# Patient Record
Sex: Male | Born: 1965 | Race: Black or African American | Hispanic: No | Marital: Single | Smoking: Never smoker
Health system: Southern US, Community
[De-identification: ages and names within clinical notes are randomized; demographics above are authoritative.]

## PROBLEM LIST (undated history)

## (undated) DIAGNOSIS — F209 Schizophrenia, unspecified: Secondary | ICD-10-CM

## (undated) DIAGNOSIS — M869 Osteomyelitis, unspecified: Secondary | ICD-10-CM

## (undated) DIAGNOSIS — E119 Type 2 diabetes mellitus without complications: Secondary | ICD-10-CM

## (undated) HISTORY — PX: BELOW KNEE LEG AMPUTATION: SUR23

---

## 2013-12-08 ENCOUNTER — Emergency Department (HOSPITAL_COMMUNITY): Payer: Medicaid Other

## 2013-12-08 ENCOUNTER — Encounter (HOSPITAL_COMMUNITY): Payer: Self-pay | Admitting: Emergency Medicine

## 2013-12-08 ENCOUNTER — Emergency Department (HOSPITAL_COMMUNITY)
Admission: EM | Admit: 2013-12-08 | Discharge: 2013-12-08 | Disposition: A | Discharge status: Home / Self Care | Payer: Medicaid Other | Attending: Emergency Medicine | Admitting: Emergency Medicine

## 2013-12-08 DIAGNOSIS — L02619 Cutaneous abscess of unspecified foot: Secondary | ICD-10-CM

## 2013-12-08 DIAGNOSIS — Z8739 Personal history of other diseases of the musculoskeletal system and connective tissue: Secondary | ICD-10-CM

## 2013-12-08 DIAGNOSIS — Z9114 Patient's other noncompliance with medication regimen: Secondary | ICD-10-CM

## 2013-12-08 DIAGNOSIS — E119 Type 2 diabetes mellitus without complications: Secondary | ICD-10-CM | POA: Diagnosis present

## 2013-12-08 DIAGNOSIS — Z9119 Patient's noncompliance with other medical treatment and regimen: Secondary | ICD-10-CM

## 2013-12-08 DIAGNOSIS — E1149 Type 2 diabetes mellitus with other diabetic neurological complication: Secondary | ICD-10-CM

## 2013-12-08 DIAGNOSIS — Z89512 Acquired absence of left leg below knee: Secondary | ICD-10-CM

## 2013-12-08 DIAGNOSIS — L03119 Cellulitis of unspecified part of limb: Principal | ICD-10-CM

## 2013-12-08 DIAGNOSIS — E1142 Type 2 diabetes mellitus with diabetic polyneuropathy: Secondary | ICD-10-CM

## 2013-12-08 DIAGNOSIS — E1021 Type 1 diabetes mellitus with diabetic nephropathy: Secondary | ICD-10-CM

## 2013-12-08 DIAGNOSIS — F39 Unspecified mood [affective] disorder: Secondary | ICD-10-CM

## 2013-12-08 DIAGNOSIS — L0390 Cellulitis, unspecified: Secondary | ICD-10-CM | POA: Diagnosis present

## 2013-12-08 DIAGNOSIS — S88119A Complete traumatic amputation at level between knee and ankle, unspecified lower leg, initial encounter: Secondary | ICD-10-CM

## 2013-12-08 DIAGNOSIS — Z91199 Patient's noncompliance with other medical treatment and regimen due to unspecified reason: Secondary | ICD-10-CM

## 2013-12-08 DIAGNOSIS — G8921 Chronic pain due to trauma: Secondary | ICD-10-CM | POA: Insufficient documentation

## 2013-12-08 DIAGNOSIS — N058 Unspecified nephritic syndrome with other morphologic changes: Secondary | ICD-10-CM | POA: Insufficient documentation

## 2013-12-08 DIAGNOSIS — L03115 Cellulitis of right lower limb: Secondary | ICD-10-CM

## 2013-12-08 DIAGNOSIS — E1029 Type 1 diabetes mellitus with other diabetic kidney complication: Secondary | ICD-10-CM | POA: Insufficient documentation

## 2013-12-08 DIAGNOSIS — E1140 Type 2 diabetes mellitus with diabetic neuropathy, unspecified: Secondary | ICD-10-CM

## 2013-12-08 DIAGNOSIS — Z91148 Patient's other noncompliance with medication regimen for other reason: Secondary | ICD-10-CM

## 2013-12-08 HISTORY — DX: Type 2 diabetes mellitus without complications: E11.9

## 2013-12-08 HISTORY — DX: Osteomyelitis, unspecified: M86.9

## 2013-12-08 LAB — CBC WITH DIFFERENTIAL/PLATELET
Basophils Absolute: 0 10*3/uL (ref 0.0–0.1)
Basophils Relative: 0 % (ref 0–1)
EOS ABS: 0.2 10*3/uL (ref 0.0–0.7)
EOS PCT: 2 % (ref 0–5)
HCT: 41.6 % (ref 39.0–52.0)
HEMOGLOBIN: 14.1 g/dL (ref 13.0–17.0)
LYMPHS PCT: 29 % (ref 12–46)
Lymphs Abs: 2.2 10*3/uL (ref 0.7–4.0)
MCH: 29.1 pg (ref 26.0–34.0)
MCHC: 33.9 g/dL (ref 30.0–36.0)
MCV: 86 fL (ref 78.0–100.0)
MONOS PCT: 8 % (ref 3–12)
Monocytes Absolute: 0.6 10*3/uL (ref 0.1–1.0)
Neutro Abs: 4.5 10*3/uL (ref 1.7–7.7)
Neutrophils Relative %: 61 % (ref 43–77)
Platelets: 293 10*3/uL (ref 150–400)
RBC: 4.84 MIL/uL (ref 4.22–5.81)
RDW: 13.6 % (ref 11.5–15.5)
WBC: 7.5 10*3/uL (ref 4.0–10.5)

## 2013-12-08 LAB — CBG MONITORING, ED
Glucose-Capillary: 94 mg/dL (ref 70–99)
Glucose-Capillary: 74 mg/dL (ref 70–99)

## 2013-12-08 LAB — BASIC METABOLIC PANEL
BUN: 30 mg/dL — AB (ref 6–23)
CALCIUM: 9.7 mg/dL (ref 8.4–10.5)
CO2: 27 mEq/L (ref 19–32)
Chloride: 100 mEq/L (ref 96–112)
Creatinine, Ser: 1.1 mg/dL (ref 0.50–1.35)
GFR calc Af Amer: >90 mL/min (ref >90)
GFR, EST NON AFRICAN AMERICAN: 79 mL/min — AB (ref >90)
Glucose, Bld: 69 mg/dL — ABNORMAL LOW (ref 70–99)
Potassium: 4.7 mEq/L (ref 3.7–5.3)
SODIUM: 140 meq/L (ref 137–147)

## 2013-12-08 LAB — SEDIMENTATION RATE: Sed Rate: 14 mm/hr (ref 0–16)

## 2013-12-08 MED ORDER — SODIUM CHLORIDE 0.9 % IV BOLUS (SEPSIS)
500.0000 mL | Freq: Once | INTRAVENOUS | Status: AC
  Administered 2013-12-08: 500 mL via INTRAVENOUS

## 2013-12-08 MED ORDER — CLINDAMYCIN HCL 150 MG PO CAPS: 450.0000 mg | ORAL_CAPSULE | Freq: Three times a day (TID) | ORAL | Status: DC

## 2013-12-08 MED ORDER — VANCOMYCIN HCL IN DEXTROSE 1-5 GM/200ML-% IV SOLN
1000.0000 mg | Freq: Once | INTRAVENOUS | Status: AC
  Administered 2013-12-08: 1000 mg via INTRAVENOUS
  Filled 2013-12-08: qty 200

## 2013-12-08 MED ORDER — CIPROFLOXACIN HCL 500 MG PO TABS
500.0000 mg | ORAL_TABLET | Freq: Once | ORAL | Status: AC
  Administered 2013-12-08: 500 mg via ORAL
  Filled 2013-12-08: qty 1

## 2013-12-08 MED ORDER — CIPROFLOXACIN HCL 500 MG PO TABS: 500.0000 mg | ORAL_TABLET | Freq: Two times a day (BID) | ORAL | Status: DC

## 2013-12-08 NOTE — ED Notes (Addendum)
Pt reports hx of diabetes and osteomyelitis which caused him to have amputation of left leg in past. Reports already had wound to right foot then had a fall approx one week ago after getting hypoglycemic. Now having increase in swelling and redness to right foot. Reports instead of pain, now has numbness to right foot.

## 2013-12-08 NOTE — ED Notes (Signed)
Spoke with pt. About starting an IV.  He  Is not sure that he wants an IV.   Explained to pt. The importance of the IV.  He is deciding if he wants an IV.  Will be back into check on him and decision.  Pt.s family is also encouraging him to have the IV  HumphreysNicole, [place] will be into speak with the pt.

## 2013-12-08 NOTE — ED Notes (Signed)
Patel MD internal medicine at bedside

## 2013-12-08 NOTE — ED Notes (Signed)
Apologized to patient about wait time. Patient understanding, calm. NAD noted. Denies constant pain.

## 2013-12-08 NOTE — ED Provider Notes (Addendum)
Patient reports he fell out of his wheelchair about 3 weeks ago and has had increasing redness and swelling of his right ankle. He has already had a amputation of his left foot for osteomyelitis.  Patient has diffuse redness and warmth with swelling especially over the medial aspect of his right foot/ankle. Please see photo.    Medical screening examination/treatment/procedure(s) were conducted as a shared visit with non-physician practitioner(s) and myself.  I personally evaluated the patient during the encounter.   EKG Interpretation None         Devoria AlbeIva Knapp, MD, Armando GangFACEP   Ward GivensIva L Knapp, MD 12/08/13 Aretha Parrot1937  Ward GivensIva L Knapp, MD 12/08/13 2016

## 2013-12-08 NOTE — ED Provider Notes (Signed)
CSN: [id number]     Arrival date & time 12/08/13  1327 History   First MD Initiated Contact with Patient 12/08/13 1559     Chief Complaint  Patient presents with  . Foot Pain     (Consider location/radiation/quality/duration/timing/severity/associated sxs/prior Treatment) HPI  Renato BattlesScott Perriello is a 48 y.o. male past medical history significant for type 1 diabetes, and describes it as brittle and difficult to control. Patient states that he was trying to manage his own diabetes for a long time without any medical oversight. He is presenting today for evaluation of right medial malleolus or the redness and paresthesia. Patient trauma to the area 3 weeks ago when he hit it on a kitchen counter. He has face line diabetic neuropathy which he believes is getting worse and has pins and needles shooting paresthesias throughout his extremities. Patient denies fever, chills, nausea, vomiting, chest pain, shortness of breath, change in bowel or bladder habits. The patient follows with podiatrist Dr. Romualdo [facility]. States he has a prior cellulitis to this area that required antibiotic palate insertion into the right medial malleolus. He was on vancomycin for several weeks as an outpatient. Patient required a wound vac as well. Patient lost the left lower extremity to an osteomyelitis in October of 2014.   Past Medical History  Diagnosis Date  . Diabetes mellitus without complication   . Osteomyelitis    Past Surgical History  Procedure Laterality Date  . Below knee leg amputation     History reviewed. No pertinent family history. History  Substance Use Topics  . Smoking status: Not on file  . Smokeless tobacco: Not on file  . Alcohol Use: No    Review of Systems  10 systems reviewed and found to be negative, except as noted in the HPI.   Allergies  Advil  Home Medications   Prior to Admission medications   Not on File   BP 150/86  Pulse 103  Temp(Src) 97.9 F (36.6 C) (Oral)   Resp 16  SpO2 100% Physical Exam  Nursing note and vitals reviewed. Constitutional: He is oriented to person, place, and time. He appears well-developed and well-nourished. No distress.  HENT:  Head: Normocephalic.  Mouth/Throat: Oropharynx is clear and moist.  Eyes: Conjunctivae and EOM are normal. Pupils are equal, round, and reactive to light.  Neck: Normal range of motion.  Cardiovascular: Normal rate, regular rhythm and intact distal pulses.   Pulmonary/Chest: Effort normal and breath sounds normal. No stridor. No respiratory distress. He has no wheezes. He has no rales. He exhibits no tenderness.  Abdominal: Soft. Bowel sounds are normal. He exhibits no distension and no mass. There is no tenderness. There is no rebound and no guarding.  Musculoskeletal: Normal range of motion. He exhibits edema and tenderness.  Left BKA  Neurological: He is alert and oriented to person, place, and time.  Skin:  5 cm area of erythema, warmth to right ankle medial malleolus. Reduced distal pulses. Patient is able to move all toes. Distal sensation is grossly intact.  Psychiatric: He has a normal mood and affect.        ED Course  Procedures (including critical care time) Labs Review Labs Reviewed  BASIC METABOLIC PANEL - Abnormal; Notable for the following:    Glucose, Bld 69 (*)    BUN 30 (*)    GFR calc non Af Amer 79 (*)    All other components within normal limits  CULTURE, BLOOD (ROUTINE X 2)  CULTURE, BLOOD (ROUTINE X 2)  CBC WITH DIFFERENTIAL  CBG MONITORING, ED  CBG MONITORING, ED    Imaging Review Dg Foot Complete Right  12/08/2013   CLINICAL DATA:  Pain and swelling.  EXAM: RIGHT FOOT COMPLETE - 3+ VIEW  COMPARISON:  CT scan 09/30/2013.  FINDINGS: Severe chronic deformity involving the Ankle Joint, midfoot and hindfoot. The talus is likely surgically absent but I do not see any solid fusion changes. There is severe osteoporosis. The forefoot bony structures are intact.   IMPRESSION: Severe chronic ankle and hindfoot changes.  Osteoporosis.  No acute bony findings.   Electronically Signed   By: Loralie ChampagneMark  Gallerani M.D.   On: 12/08/2013 17:40     EKG Interpretation None      MDM   Final diagnoses:  Cellulitis of right foot  Hx of BKA, left  Type 1 diabetes mellitus with nephropathy  History of medication noncompliance   Filed Vitals:   12/08/13 2100 12/08/13 2130 12/08/13 2145 12/08/13 2200  BP: 137/78 149/81  152/81  Pulse: 101  100 97  Temp:      TempSrc:      Resp: 15  16 15   SpO2: 100%  100% 99%    Medications  sodium chloride 0.9 % bolus 500 mL (not administered)  sodium chloride 0.9 % bolus 500 mL (not administered)  vancomycin (VANCOCIN) IVPB 1000 mg/200 mL premix (not administered)    Renato BattlesScott Gehres is a 48 y.o. male presenting with increasing peripheral neuropathy and cellulitis to right medial malleolus. Patient had trauma approximately 3 weeks ago. Redness and warmth have worsened over the last few weeks.x-ray shows no signs of osteomyelitis. Patient with no systemic signs of infection, no leukocytosis.  Orthopedic consult from Dr. Jean Rosenthalhristine Wright appreciated: Agrees with admission for IV antibiotics as there is no indication for emergent surgical intervention. The patient would like to continue following with Dr. Romualdo Bolkial he would have to be [facility].   Dr. Allena KatzPatel, has evaluated the patient does not feel that the patient warned to admission. He is toxic the patient's podiatrist on call. Plan is to administer by mouth antibiotics and have the patient follow in the office tomorrow.  This is a shared visit with the attending physician who personally evaluated the patient and agrees with the care plan.    Evaluation does not show pathology that would require ongoing emergent intervention or inpatient treatment. Pt is hemodynamically stable and mentating appropriately. Discussed findings and plan with patient/guardian, who  agrees with care plan. All questions answered. Return precautions discussed and outpatient follow up given.   Discharge Medication List as of 12/08/2013 10:03 PM    START taking these medications   Details  ciprofloxacin (CIPRO) 500 MG tablet Take 1 tablet (500 mg total) by mouth 2 (two) times daily. One po bid x 7 days, Starting 12/08/2013, Until Discontinued, Print    clindamycin (CLEOCIN) 150 MG capsule Take 3 capsules (450 mg total) by mouth 3 (three) times daily., Starting 12/08/2013, Until Discontinued, [employer]           Nicole Pisciotta, PA-C 12/09/13 [phone]

## 2013-12-08 NOTE — Discharge Instructions (Signed)
Do not hesitate to return to the Emergency Department for any new, worsening or concerning symptoms.   If you do not have a primary care doctor you can establish one at the   [facility]: [address] [place] [id number] [phone]  After you establish care. Let them know you were seen in the emergency room. They must obtain records for further management.    Cellulitis Cellulitis is an infection of the skin and the tissue beneath it. The infected area is usually red and tender. Cellulitis occurs most often in the arms and lower legs.  CAUSES  Cellulitis is caused by bacteria that enter the skin through cracks or cuts in the skin. The most common types of bacteria that cause cellulitis are Staphylococcus and Streptococcus. SYMPTOMS   Redness and warmth.  Swelling.  Tenderness or pain.  Fever. DIAGNOSIS  Your caregiver can usually determine what is wrong based on a physical exam. Blood tests may also be done. TREATMENT  Treatment usually involves taking an antibiotic medicine. HOME CARE INSTRUCTIONS   Take your antibiotics as directed. Finish them even if you start to feel better.  Keep the infected arm or leg elevated to reduce swelling.  Apply a warm cloth to the affected area up to 4 times per day to relieve pain.  Only take over-the-counter or prescription medicines for pain, discomfort, or fever as directed by your caregiver.  Keep all follow-up appointments as directed by your caregiver. SEEK MEDICAL CARE IF:   You notice red streaks coming from the infected area.  Your red area gets larger or turns dark in color.  Your bone or joint underneath the infected area becomes painful after the skin has healed.  Your infection returns in the same area or another area.  You notice a swollen bump in the infected area.  You develop new symptoms. SEEK IMMEDIATE MEDICAL CARE IF:   You have a fever.  You feel very sleepy.  You develop vomiting  or diarrhea.  You have a general ill feeling (malaise) with muscle aches and pains. MAKE SURE YOU:   Understand these instructions.  Will watch your condition.  Will get help right away if you are not doing well or get worse. Document Released: 03/13/2005 Document Revised: 12/03/2011 Document Reviewed: 08/19/2011 [facility] Patient Information 2015 [employer], [place]. This information is not intended to replace advice given to you by your health care provider. Make sure you discuss any questions you have with your health care provider.

## 2013-12-08 NOTE — Consult Note (Addendum)
[facility] Initial consult note  Patient: Dale Hunter  FRT:[id number]  DOB: Nov 14, 1965  DOS: the patient was seen and examined on 12/08/2013 PCP: [facility]  Chief Complaint: Right foot redness  HPI: Dale Hunter is a 48 y.o. male with Past medical history of diabetes mellitus, osteomyelitis, charcot foot. Patient presented with complaints of right foot redness. He mentions 3 weeks ago he had an episode of hypoglycemia and was in the kitchen and had of fall with which he hit the kitchen floor and had some injury on his right foot. He mentions after this injury he has seen his podiatrist and was recommended conservative management. Today he comes back because he initially had a sensation of tingling and numbness all over his body. No fall no trauma no fever no chills no burning urination no diarrhea no constipation no chest pain or shortness of breath no nausea no vomiting no abdominal pain. No itching or rash. No dizziness no lightheadedness no vision changes. No cough no congestion. He mentions he is using short-acting insulin with a sliding scale. His PCP has prescribed him on Levemir but is not using it on a regular basis. He has extensive history of osteomyelitis with left foot BKA secondary to the same and also had extensive osteomyelitis of the right foot requiring prolonged treatment. His all the care has been done at [facility] and [facility]. He Lives with his parents  The patient is coming from home. And at his baseline independent for most of his ADL.  Review of Systems: as mentioned in the history of present illness.  A Comprehensive review of the other systems is negative.  Past Medical History  Diagnosis Date  . Diabetes mellitus without complication   . Osteomyelitis    Past Surgical History  Procedure Laterality Date  . Below knee leg amputation     Social History:  reports that he does not drink  alcohol or use illicit drugs. His tobacco history is not on file.  Allergies  Allergen Reactions  . Advil [Ibuprofen] Rash    History reviewed. No pertinent family history.  Prior to Admission medications   Medication Sig Start Date End Date Taking? Authorizing Provider  insulin aspart (NOVOLOG) 100 UNIT/ML injection Inject 13 Units into the skin 3 (three) times daily before meals.   Yes Historical Provider, MD  insulin detemir (LEVEMIR) 100 UNIT/ML injection Inject 15 Units into the skin at bedtime.   Yes Historical Provider, MD  insulin regular (NOVOLIN R,HUMULIN R) 100 units/mL injection Inject 18-20 Units into the skin 3 (three) times daily before meals. Sliding scale   Yes Historical Provider, MD    Physical Exam: Filed Vitals:   12/08/13 1900 12/08/13 1915 12/08/13 1930 12/08/13 2000  BP: 162/84  160/96 161/93  Pulse: 106 108  104  Temp:      TempSrc:      Resp: '12 17  16  ' SpO2: 100% 94%  98%    General: Alert, Awake and Oriented to Time, Place and Person. Appear in no distress, appears anxious Eyes: PERRL ENT: Oral Mucosa clear moist. Neck: no JVD Cardiovascular: S1 and S2 Present, no Murmur, Peripheral Pulses Present Respiratory: Bilateral Air entry equal and Decreased, Clear to Auscultation,  no Crackles,no wheezes Abdomen: Bowel Sound Present, Soft and Non tender Skin: no Rash Extremities: no Pedal edema, no calf tenderness No tenderness on the ankle joint, toes, knee joint. Range of motion full at knee, limited at ankle  due to prior surgery Diffuse eczema on right leg, right heel redness and soft tissue swelling with some warmth. Neurologic: Grossly no focal neuro deficit. Mental status AAOx3, speech normal, attention normal, Cranial Nerves PERRL, EOM normal and present, facial sensation to light touch present: , Motor strength bilateral equal strength 5/5, Sensation present to light touch, reflexes present knee and biceps, Cerebellar test normal finger nose  finger.  Labs on Admission:  CBC:  Recent Labs Lab 12/08/13 1628  WBC 7.5  NEUTROABS 4.5  HGB 14.1  HCT 41.6  MCV 86.0  PLT 293    CMP     Component Value Date/Time   NA 140 12/08/2013 1628   K 4.7 12/08/2013 1628   CL 100 12/08/2013 1628   CO2 27 12/08/2013 1628   GLUCOSE 69* 12/08/2013 1628   BUN 30* 12/08/2013 1628   CREATININE 1.10 12/08/2013 1628   CALCIUM 9.7 12/08/2013 1628   GFRNONAA 79* 12/08/2013 1628   GFRAA >90 12/08/2013 1628    No results found for this basename: LIPASE, AMYLASE,  in the last 168 hours No results found for this basename: AMMONIA,  in the last 168 hours  No results found for this basename: CKTOTAL, CKMB, CKMBINDEX, TROPONINI,  in the last 168 hours BNP (last 3 results) No results found for this basename: PROBNP,  in the last 8760 hours  Radiological Exams on Admission: Dg Foot Complete Right  12/08/2013   CLINICAL DATA:  Pain and swelling.  EXAM: RIGHT FOOT COMPLETE - 3+ VIEW  COMPARISON:  CT scan 09/30/2013.  FINDINGS: Severe chronic deformity involving the Ankle Joint, midfoot and hindfoot. The talus is likely surgically absent but I do not see any solid fusion changes. There is severe osteoporosis. The forefoot bony structures are intact.  IMPRESSION: Severe chronic ankle and hindfoot changes.  Osteoporosis.  No acute bony findings.   Electronically Signed   By: Kalman Jewels M.D.   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 Cellulitis Patient presents with complain of left leg redness which he has noticed after his recent injury. X-ray does not show any evidence of osteomyelitis. He does not have any leukocytosis. No fever. Mild tachycardia and hypertension can be due to anxiety. Does not have any pain or new limitation of range of motion at the ankle joint or knee  joint. He does not appear toxic on examination. With this patient does not meet the criteria for an inpatient admission. Patient lives with his family and finally appears reasonable although patient appears anxious. I discussed the case with patient's primary podiatry service at [facility] on call Dr. Leane Call who agreed with the plan to follow the patient as an outpatient tomorrow. If the patient's ESR is within normal range I recommend to discharge the patient on clindamycin and ciprofloxacin which will cover both MRSA and Pseudomonas in a diabetic patient with prior history of osteomyelitis for 14 days. Clindamycin 450 mg every 8 hours. Ciprofloxacin 500 mg twice a day.  I discussed with the family about compliance with antibiotics and they will make sure the patient gets his medications. Also discussed the importance of continuation of basal bolus insulin regimen as prescribed to maintain blood sugars within acceptable range during the period of infection.  Consults: Discussed with podiatry service on call  Author:  Berle Mull, MD [facility] Pager: [phone] 12/08/2013, 9:12 PM    If 7PM-7AM, please contact night-coverage [email or web address] Password [email or web address]  **Disclaimer: This note may have been dictated with voice recognition software. Similar sounding words can inadvertently be transcribed and this note may contain transcription errors which may not have been corrected upon publication of note.**

## 2013-12-09 LAB — C-REACTIVE PROTEIN: CRP: 0.9 mg/dL — ABNORMAL HIGH (ref <0.60)

## 2013-12-10 NOTE — ED Provider Notes (Signed)
See prior note   Ward GivensIva L Knapp, MD 12/10/13 [phone]

## 2013-12-12 ENCOUNTER — Encounter (HOSPITAL_COMMUNITY): Payer: Self-pay | Admitting: Emergency Medicine

## 2013-12-12 ENCOUNTER — Emergency Department (HOSPITAL_COMMUNITY)
Admission: EM | Admit: 2013-12-12 | Discharge: 2013-12-13 | Disposition: A | Discharge status: Home / Self Care | Payer: MEDICAID | Attending: Emergency Medicine | Admitting: Emergency Medicine

## 2013-12-12 DIAGNOSIS — Z794 Long term (current) use of insulin: Secondary | ICD-10-CM | POA: Insufficient documentation

## 2013-12-12 DIAGNOSIS — F3289 Other specified depressive episodes: Secondary | ICD-10-CM | POA: Diagnosis present

## 2013-12-12 DIAGNOSIS — E119 Type 2 diabetes mellitus without complications: Secondary | ICD-10-CM | POA: Diagnosis not present

## 2013-12-12 DIAGNOSIS — Z4801 Encounter for change or removal of surgical wound dressing: Secondary | ICD-10-CM | POA: Diagnosis not present

## 2013-12-12 DIAGNOSIS — F329 Major depressive disorder, single episode, unspecified: Secondary | ICD-10-CM | POA: Insufficient documentation

## 2013-12-12 DIAGNOSIS — Z792 Long term (current) use of antibiotics: Secondary | ICD-10-CM | POA: Insufficient documentation

## 2013-12-12 DIAGNOSIS — Z8739 Personal history of other diseases of the musculoskeletal system and connective tissue: Secondary | ICD-10-CM | POA: Insufficient documentation

## 2013-12-12 DIAGNOSIS — F39 Unspecified mood [affective] disorder: Secondary | ICD-10-CM | POA: Insufficient documentation

## 2013-12-12 DIAGNOSIS — F32A Depression, unspecified: Secondary | ICD-10-CM

## 2013-12-12 DIAGNOSIS — Z79899 Other long term (current) drug therapy: Secondary | ICD-10-CM | POA: Diagnosis not present

## 2013-12-12 LAB — CBG MONITORING, ED
GLUCOSE-CAPILLARY: 240 mg/dL — AB (ref 70–99)
GLUCOSE-CAPILLARY: 246 mg/dL — AB (ref 70–99)
GLUCOSE-CAPILLARY: 411 mg/dL — AB (ref 70–99)
GLUCOSE-CAPILLARY: 484 mg/dL — AB (ref 70–99)
Glucose-Capillary: 15 mg/dL — CL (ref 70–99)

## 2013-12-12 LAB — COMPREHENSIVE METABOLIC PANEL
ALT: 30 U/L (ref 0–53)
AST: 27 U/L (ref 0–37)
Albumin: 3.5 g/dL (ref 3.5–5.2)
Alkaline Phosphatase: 77 U/L (ref 39–117)
BUN: 36 mg/dL — ABNORMAL HIGH (ref 6–23)
CO2: 24 mEq/L (ref 19–32)
Calcium: 9.6 mg/dL (ref 8.4–10.5)
Chloride: 97 mEq/L (ref 96–112)
Creatinine, Ser: 1.24 mg/dL (ref 0.50–1.35)
GFR calc Af Amer: 79 mL/min — ABNORMAL LOW (ref >90)
GFR calc non Af Amer: 68 mL/min — ABNORMAL LOW (ref >90)
Glucose, Bld: 91 mg/dL (ref 70–99)
Potassium: 4.3 mEq/L (ref 3.7–5.3)
Sodium: 137 mEq/L (ref 137–147)
Total Bilirubin: 0.4 mg/dL (ref 0.3–1.2)
Total Protein: 7.2 g/dL (ref 6.0–8.3)

## 2013-12-12 LAB — CBC WITH DIFFERENTIAL/PLATELET
Basophils Absolute: 0.1 10*3/uL (ref 0.0–0.1)
Basophils Relative: 1 % (ref 0–1)
Eosinophils Absolute: 0.1 10*3/uL (ref 0.0–0.7)
Eosinophils Relative: 1 % (ref 0–5)
HCT: 39 % (ref 39.0–52.0)
Hemoglobin: 13.4 g/dL (ref 13.0–17.0)
Lymphocytes Relative: 26 % (ref 12–46)
Lymphs Abs: 2.2 10*3/uL (ref 0.7–4.0)
MCH: 29.1 pg (ref 26.0–34.0)
MCHC: 34.4 g/dL (ref 30.0–36.0)
MCV: 84.8 fL (ref 78.0–100.0)
Monocytes Absolute: 0.5 10*3/uL (ref 0.1–1.0)
Monocytes Relative: 6 % (ref 3–12)
Neutro Abs: 5.8 10*3/uL (ref 1.7–7.7)
Neutrophils Relative %: 66 % (ref 43–77)
Platelets: 263 10*3/uL (ref 150–400)
RBC: 4.6 MIL/uL (ref 4.22–5.81)
RDW: 13.4 % (ref 11.5–15.5)
WBC: 8.7 10*3/uL (ref 4.0–10.5)

## 2013-12-12 LAB — URINALYSIS, ROUTINE W REFLEX MICROSCOPIC
Bilirubin Urine: NEGATIVE
Glucose, UA: 500 mg/dL — AB
Ketones, ur: NEGATIVE mg/dL
Leukocytes, UA: NEGATIVE
Nitrite: NEGATIVE
Protein, ur: 30 mg/dL — AB
Specific Gravity, Urine: 1.016 (ref 1.005–1.030)
Urobilinogen, UA: 0.2 mg/dL (ref 0.0–1.0)
pH: 5 (ref 5.0–8.0)

## 2013-12-12 LAB — URINE MICROSCOPIC-ADD ON

## 2013-12-12 MED ORDER — INSULIN DETEMIR 100 UNIT/ML ~~LOC~~ SOLN
15.0000 [IU] | Freq: Every day | SUBCUTANEOUS | Status: DC
  Administered 2013-12-13: 15 [IU] via SUBCUTANEOUS
  Filled 2013-12-12 (×2): qty 0.15

## 2013-12-12 MED ORDER — CLINDAMYCIN HCL 300 MG PO CAPS
450.0000 mg | ORAL_CAPSULE | Freq: Three times a day (TID) | ORAL | Status: DC
  Administered 2013-12-12 – 2013-12-13 (×2): 450 mg via ORAL
  Filled 2013-12-12 (×5): qty 1

## 2013-12-12 MED ORDER — INSULIN ASPART 100 UNIT/ML ~~LOC~~ SOLN
15.0000 [IU] | Freq: Three times a day (TID) | SUBCUTANEOUS | Status: DC
  Administered 2013-12-12: 13 [IU] via SUBCUTANEOUS
  Administered 2013-12-13: 13:00:00 via SUBCUTANEOUS
  Filled 2013-12-12 (×2): qty 1

## 2013-12-12 MED ORDER — DEXTROSE 50 % IV SOLN
INTRAVENOUS | Status: AC
  Filled 2013-12-12: qty 50

## 2013-12-12 MED ORDER — VITAMIN D3 25 MCG (1000 UNIT) PO TABS
4000.0000 [IU] | ORAL_TABLET | Freq: Every day | ORAL | Status: DC
  Administered 2013-12-12 – 2013-12-13 (×2): 4000 [IU] via ORAL
  Filled 2013-12-12 (×3): qty 4

## 2013-12-12 MED ORDER — VITAMIN D3 50 MCG (2000 UT) PO TABS: 4000.0000 [IU] | ORAL_TABLET | Freq: Every day | ORAL | Status: DC

## 2013-12-12 MED ORDER — DEXTROSE 50 % IV SOLN
1.0000 | Freq: Once | INTRAVENOUS | Status: AC
  Administered 2013-12-12: 50 mL via INTRAVENOUS

## 2013-12-12 NOTE — ED Notes (Addendum)
Pt in paper scrubs and wanded by security.  

## 2013-12-12 NOTE — ED Notes (Signed)
Pt states he took addl insulin this am "because my glucose was high and I took the extra to get down"

## 2013-12-12 NOTE — ED Notes (Signed)
Telepsych machine at bedside and in process.

## 2013-12-12 NOTE — ED Notes (Addendum)
Pt here for re check of right foot. He has been taking abx for a few days. Pt unable to tell me about foot. Pt having a hard time getting words out or explaining exactly why he is here. Per pt mother pt has a lot of concerns about possibly loosing the right foot and has been shaking and anxious. Pt also had a fall 1 week ago.

## 2013-12-12 NOTE — ED Notes (Signed)
Pt states he is unable to give urine at this time.

## 2013-12-12 NOTE — ED Provider Notes (Signed)
3:59 PM Patient signed out to me by Lawyer, PA-C.  Patient was given D50 in the emergency department, because his blood sugar was 15. He states that he took approximately 40 units of insulin. He states that he took more than his typical dose because he missed his low room air last night. After treatment with the D50, his blood sugar was in the 240s. After reassessment by prior provider, steam the patient was appropriate for discharge to home medically, but there is concern about patient being depressed, and possibly suicidal. TTS was consult.  Patient has wound infection.  Appears to be improving.  Continue clindamycin.    Patient also seems somewhat depressed.  Will obtain TTS consultation.  After TTS, recommend discharge to home with clinda.  TTS recommends evaluation by telepsych. This consultation is still pending. Have educated the patient regarding the use of insulin, and stressed the importance of maintaining a regular schedule, and not adjusting his medication without consulting his primary care physician.  11:45 PM Discussed patient with psychiatry nurse practitioner, who advises the patient's stay in the emergency department overnight, to be seen by the psychiatrist in the morning. Recommend social work consultation for medication evaluation as well as for transportation to outpatient followup visits. Will move to Pod C.  1:04 AM Patient discussed with Dr. Jodi MourningZavitz, who is made aware of the plan.  Roxy Horsemanobert Autumne Kallio, PA-C 12/13/13 0104

## 2013-12-12 NOTE — Consult Note (Signed)
Telepsych Consultation   Reason for Consult:  Referral for tele-psych Referring Physician:  EDP/Browning PA-C Dale Hunter is an 48 y.o. male.  Assessment: AXIS I:  Major Depression, Recurrent severe AXIS II:  Deferred AXIS III:   Past Medical History  Diagnosis Date  . Diabetes mellitus without complication   . Osteomyelitis    AXIS IV:  economic problems, housing problems, other psychosocial or environmental problems, problems with access to health care services and problems with primary support group AXIS V:  31-40 impairment in reality testing  Plan:  Remain in ED overnight with re-evaluation by psychiatrist in the morning.  Subjective:   Dale Hunter is a 48 y.o. male patient who presented to [facility] ED for recheck of cellulitis to his right foot.  Patient was deemed appropriate for discharge to home medically, but there was concern about patient being depressed, and possibly suicidal and a consult to TTS was ordered.  Patient has been assessed by Shaune Pascal, LPC earlier today. Tele-psych ordered for possible discharge home. Patient reports that he came to the ED for re-evaluation of the antibiotics he is for an infection in his ankle.  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 Patient states that now he is facing the possibility of having his right foot amputated and this is very stressful for the patient.  He reports he has a PMH of Diabetes Type I, Depression and Charcot foot.  Patient endorses that he is depressed currently; rates his depression 5/10. He currently denies suicidal/homicidal ideation, intent or plan. He denies AVH.  Patient was asked if he felt safe going home to which he answered after a long pause "no, I can't get out 'cause I'm stuck in a wheelchair." Patient states he has to depend on his father to drive him everywhere. Patient currently lives with his parents and he states he is not okay living with his  parents. Patient describes his current living situation as "a dark house." He states he is an only child and that he is concerned because his mother is disabled also and having the amputation "put a dent in being able to take her places." After a long pause, patient goes on the state that another concern of his is "I was so overwhelmed with everything that happened that I wasn't able to complete physical therapy January-April." "I was battling in my own mind and it was hard to stay focused; it's still hard to stay focused." Patient states that he has never talked with anyone about his depression nor has he been on medications for depression. Patient states he never talked with anyone after his amputation other than family friends.  Patient obsesses about about his legs and the current state of his right leg. Patient states he feels he needs to talk to someone but states "don't see what good it would do at this point; even talking to someone won't bring my legs back." Patient states he doesn't sleep well, averaging "maybe 6 hours at night"; states he has trouble falling asleep and staying asleep.  Patient also endorses that he feels helpless and at times hopeless and that he is not as motivated as he once was.  During assessment, patient would pause for long periods and exhibited decreased concentration.   HPI:  48 yo male who presented for recheck of cellulitis to his foot. HPI Elements:   Location:  Mood. Quality:  Depressed. Severity:  Moderate. Timing:  Onset 03/2013 after amputation. Duration:  Worsening since 05/2013. Context:  Medical condition; Left BKA, right ankle amputation.  Past Psychiatric History: Past Medical History  Diagnosis Date  . Diabetes mellitus without complication   . Osteomyelitis     reports that he has never smoked. He does not have any smokeless tobacco history on file. He reports that he does not drink alcohol or use illicit drugs. History reviewed. No pertinent family  history. Family History Substance Abuse: Yes, Describe: (ETOH on father's side) Family Supports: Yes, List: (parents) Living Arrangements: Parent Can pt return to current living arrangement?: Yes Allergies:   Allergies  Allergen Reactions  . Advil [Ibuprofen] Rash    ACT Assessment Complete:  Yes:    Educational Status    Risk to Self: Risk to self Suicidal Ideation: Yes-Currently Present Suicidal Intent: No Is patient at risk for suicide?: Yes Suicidal Plan?: No Access to Means: No What has been your use of drugs/alcohol within the last 12 months?: pt denies hx of SA Previous Attempts/Gestures: No How many times?: 0 Other Self Harm Risks: pt denies Triggers for Past Attempts: None known Intentional Self Injurious Behavior: None Family Suicide History: No Recent stressful life event(s): Recent negative physical changes;Other (Comment) (SI, depression, leg amputated in 03/2013) Persecutory voices/beliefs?: No Depression: Yes Depression Symptoms: Despondent;Insomnia;Isolating;Fatigue;Loss of interest in usual pleasures;Feeling worthless/self pity Substance abuse history and/or treatment for substance abuse?: No Suicide prevention information given to non-admitted patients: Not applicable  Risk to Others: Risk to Others Homicidal Ideation: No Thoughts of Harm to Others: No Current Homicidal Intent: No Current Homicidal Plan: No Access to Homicidal Means: No Identified Victim: na - pt denies History of harm to others?: No Assessment of Violence: None Noted Violent Behavior Description: na - pt calm, cooperative Does patient have access to weapons?: No Criminal Charges Pending?: No Does patient have a court date: No  Abuse: Abuse/Neglect Assessment (Assessment to be complete while patient is alone) Physical Abuse: Denies Verbal Abuse: Yes, past (Comment) (In past, but did not articulate) Sexual Abuse: Denies Exploitation of patient/patient's resources:  Denies Self-Neglect: Denies  Prior Inpatient Therapy: Prior Inpatient Therapy Prior Inpatient Therapy: No Prior Therapy Dates: na Prior Therapy Facilty/Provider(s): na Reason for Treatment: na  Prior Outpatient Therapy: Prior Outpatient Therapy Prior Outpatient Therapy: Yes Prior Therapy Dates:  (Years ago when pt in college) Prior Therapy Facilty/Provider(s): Unknown counselors Reason for Treatment: Depression  Additional Information: Additional Information 1:1 In Past 12 Months?: No CIRT Risk: No Elopement Risk: No Does patient have medical clearance?: Yes    Objective: Blood pressure 119/91, pulse 115, temperature 97.7 F (36.5 C), temperature source Oral, resp. rate 19, SpO2 100.00%.There is no height or weight on file to calculate BMI. Results for orders placed during the hospital encounter of 12/12/13 (from the past 72 hour(s))  CBC WITH DIFFERENTIAL     Status: None   Collection Time    12/12/13 12:40 PM      Result Value Ref Range   WBC 8.7  4.0 - 10.5 K/uL   RBC 4.60  4.22 - 5.81 MIL/uL   Hemoglobin 13.4  13.0 - 17.0 g/dL   HCT 39.0  39.0 - 52.0 %   MCV 84.8  78.0 - 100.0 fL   MCH 29.1  26.0 - 34.0 pg   MCHC 34.4  30.0 - 36.0 g/dL   RDW 13.4  11.5 - 15.5 %   Platelets 263  150 - 400 K/uL   Neutrophils Relative % 66  43 - 77 %   Neutro Abs 5.8  1.7 - 7.7 K/uL   Lymphocytes Relative 26  12 - 46 %   Lymphs Abs 2.2  0.7 - 4.0 K/uL   Monocytes Relative 6  3 - 12 %   Monocytes Absolute 0.5  0.1 - 1.0 K/uL   Eosinophils Relative 1  0 - 5 %   Eosinophils Absolute 0.1  0.0 - 0.7 K/uL   Basophils Relative 1  0 - 1 %   Basophils Absolute 0.1  0.0 - 0.1 K/uL  COMPREHENSIVE METABOLIC PANEL     Status: Abnormal   Collection Time    12/12/13 12:40 PM      Result Value Ref Range   Sodium 137  137 - 147 mEq/L   Potassium 4.3  3.7 - 5.3 mEq/L   Chloride 97  96 - 112 mEq/L   CO2 24  19 - 32 mEq/L   Glucose, Bld 91  70 - 99 mg/dL   BUN 36 (*) 6 - 23 mg/dL   Creatinine,  Ser 1.24  0.50 - 1.35 mg/dL   Calcium 9.6  8.4 - 10.5 mg/dL   Total Protein 7.2  6.0 - 8.3 g/dL   Albumin 3.5  3.5 - 5.2 g/dL   AST 27  0 - 37 U/L   ALT 30  0 - 53 U/L   Alkaline Phosphatase 77  39 - 117 U/L   Total Bilirubin 0.4  0.3 - 1.2 mg/dL   GFR calc non Af Amer 68 (*) >90 mL/min   GFR calc Af Amer 79 (*) >90 mL/min   Comment: (NOTE)     The eGFR has been calculated using the CKD EPI equation.     This calculation has not been validated in all clinical situations.     eGFR's persistently <90 mL/min signify possible Chronic Kidney     Disease.  CBG MONITORING, ED     Status: Abnormal   Collection Time    12/12/13  2:35 PM      Result Value Ref Range   Glucose-Capillary 15 (*) 70 - 99 mg/dL  CBG MONITORING, ED     Status: Abnormal   Collection Time    12/12/13  2:50 PM      Result Value Ref Range   Glucose-Capillary 240 (*) 70 - 99 mg/dL  CBG MONITORING, ED     Status: Abnormal   Collection Time    12/12/13  3:36 PM      Result Value Ref Range   Glucose-Capillary 246 (*) 70 - 99 mg/dL  URINALYSIS, ROUTINE W REFLEX MICROSCOPIC     Status: Abnormal   Collection Time    12/12/13  5:26 PM      Result Value Ref Range   Color, Urine YELLOW  YELLOW   APPearance CLEAR  CLEAR   Specific Gravity, Urine 1.016  1.005 - 1.030   pH 5.0  5.0 - 8.0   Glucose, UA 500 (*) NEGATIVE mg/dL   Hgb urine dipstick SMALL (*) NEGATIVE   Bilirubin Urine NEGATIVE  NEGATIVE   Ketones, ur NEGATIVE  NEGATIVE mg/dL   Protein, ur 30 (*) NEGATIVE mg/dL   Urobilinogen, UA 0.2  0.0 - 1.0 mg/dL   Nitrite NEGATIVE  NEGATIVE   Leukocytes, UA NEGATIVE  NEGATIVE  URINE MICROSCOPIC-ADD ON     Status: None   Collection Time    12/12/13  5:26 PM      Result Value Ref Range   Squamous  Epithelial / LPF RARE  RARE   RBC / HPF 0-2  <3 RBC/hpf  CBG MONITORING, ED     Status: Abnormal   Collection Time    12/12/13  6:03 PM      Result Value Ref Range   Glucose-Capillary 411 (*) 70 - 99 mg/dL  CBG  MONITORING, ED     Status: Abnormal   Collection Time    12/12/13  8:06 PM      Result Value Ref Range   Glucose-Capillary 484 (*) 70 - 99 mg/dL   Labs are reviewed and are pertinent for glucose 411.  Current Facility-Administered Medications  Medication Dose Route Frequency Provider Last Rate Last Dose  . cholecalciferol (VITAMIN D) tablet 4,000 Units  4,000 Units Oral Daily Montine Circle, PA-C   4,000 Units at 12/12/13 2059  . clindamycin (CLEOCIN) capsule 450 mg  450 mg Oral TID Montine Circle, PA-C   450 mg at 12/12/13 2059  . [START ON 12/13/2013] insulin aspart (novoLOG) injection 15-18 Units  15-18 Units Subcutaneous TID WC Montine Circle, PA-C   13 Units at 12/12/13 2030  . insulin detemir (LEVEMIR) injection 15-18 Units  15-18 Units Subcutaneous QHS Montine Circle, PA-C       Current Outpatient Prescriptions  Medication Sig Dispense Refill  . Cholecalciferol (VITAMIN D3) 2000 UNITS TABS Take 4,000 Units by mouth daily.      . ciprofloxacin (CIPRO) 500 MG tablet Take 1 tablet (500 mg total) by mouth 2 (two) times daily. One po bid x 7 days  24 tablet  0  . clindamycin (CLEOCIN) 150 MG capsule Take 3 capsules (450 mg total) by mouth 3 (three) times daily.  126 capsule  0  . insulin detemir (LEVEMIR) 100 UNIT/ML injection Inject 15-18 Units into the skin at bedtime.       . insulin regular (NOVOLIN R,HUMULIN R) 100 units/mL injection Inject 18-20 Units into the skin 3 (three) times daily before meals. Sliding scale      . Omega-3 Fatty Acids (FISH OIL) 1200 MG CAPS Take 1,200 mg by mouth 2 (two) times daily.        Psychiatric Specialty Exam:     Blood pressure 119/91, pulse 115, temperature 97.7 F (36.5 C), temperature source Oral, resp. rate 19, SpO2 100.00%.There is no height or weight on file to calculate BMI.  General Appearance: Disheveled  Eye Contact::  Poor  Speech:  Clear and Coherent and Slow  Volume:  Normal  Mood:  Depressed  Affect:  Flat  Thought Process:   Circumstantial  Orientation:  Full (Time, Place, and Person)  Thought Content:  Obsessions  Suicidal Thoughts:  No  Homicidal Thoughts:  No  Memory:  Immediate;   Good Recent;   Good Remote;   Fair  Judgement:  Intact  Insight:  Lacking  Psychomotor Activity:  Normal  Concentration:  Fair  Recall:  Fair  Akathisia:  No  Handed:  Right  AIMS (if indicated):     Assets:  Communication Skills Desire for Improvement Housing  Sleep:      Treatment Plan Summary: 1. Recommend patient remain in ED overnight; re-evaluation in AM by psychiatrist 2. Consult social work to assist patient with establishing Medicaid transportation and facilitating outpatient follow-up for behavioral health therapy  Leodis Liverpool, PA-C updated on recommendation at 2344.   Disposition: Disposition Initial Assessment Completed for this Encounter: Yes Disposition of Patient: Other dispositions Other disposition(s): Other (Comment) (Pt to remain in ED overnight for observation/reassess in  AM)  Serena Colonel, FNP-BC 12/12/2013 10:29 PM  I agreed with the findings, treatment and disposition plan of this patient. Berniece Andreas, MD

## 2013-12-12 NOTE — ED Notes (Signed)
Pt to have 2nd telepsych done at 8pm.

## 2013-12-12 NOTE — ED Notes (Signed)
Pt expressed anxiety regarding elevated glucose pt states he takes novolog with each meal when asked amount he stated "13 or so" asked what he meant by "or so" pt did not answer asked pt if he was on a slidng scale and pt stated "no" then added he "adjusts" his addl insulin "on what I am eating" by looking at his food pt states he takes anywhere from 13 units to 18 units pt then informed writer that he was prescribed 13 units novolog to be taken with each meal and that he is adjusting his own insulin. Explained to the pt and his parents who were present the importance of taking only what his doctor prescribed that if the pt adjusts his meds without doctor prescribing it is difficult to know if the meds are working appropriately. Pt having difficulty seeing the connection between adjusting insulin as he did this am to 40 units of novolog since he did not take the levemir last night and the glucose of 15 and why he was not given novolog after he ate and was given D50W earlier.

## 2013-12-12 NOTE — ED Notes (Signed)
AC and charge RN made aware of need for sitter.

## 2013-12-12 NOTE — ED Notes (Addendum)
When asked pt if he felt like hurting self - pt was silent for approx 45 seconds before answering, states is depressed, would like to talk with someone, states "I am not sure that I would NOT hurt myself, if I had both legs amputated, i could not say for sure."  No specific plan. " I do not have a plan that far out , but I would like to leave the planet. "  Also admits to being a hoarder-- parents went through belongings when pt had his amputation, when pt got home from hospital, things were not the same.

## 2013-12-12 NOTE — ED Provider Notes (Signed)
CSN: [id number]     Arrival date & time 12/12/13  1127 History   First MD Initiated Contact with Patient 12/12/13 1145     Chief Complaint  Patient presents with  . Wound Infection     (Consider location/radiation/quality/duration/timing/severity/associated sxs/prior Treatment) HPI Patient presents to the emergency department with recheck of his cellulitis of his right foot.  Patient, states, that the antibiotics may be causing him to have diarrhea.  Patient, states, it that's bothersome to him.  Patient denies chest pain, shortness of breath, nausea, vomiting, headache, blurred vision, weakness, dizziness, back pain, fever, or syncope.  Patient, states, that nothing seems to make his condition, better or worse.  Patient is not the best historian.  I did obtain some history from his parents Past Medical History  Diagnosis Date  . Diabetes mellitus without complication   . Osteomyelitis    Past Surgical History  Procedure Laterality Date  . Below knee leg amputation     History reviewed. No pertinent family history. History  Substance Use Topics  . Smoking status: Never Smoker   . Smokeless tobacco: Not on file  . Alcohol Use: No    Review of Systems  All other systems negative except as documented in the HPI. All pertinent positives and negatives as reviewed in the HPI.  Allergies  Advil  Home Medications   Prior to Admission medications   Medication Sig Start Date End Date Taking? Authorizing Provider  Cholecalciferol (VITAMIN D3) 2000 UNITS TABS Take 4,000 Units by mouth daily.   Yes Historical Provider, MD  ciprofloxacin (CIPRO) 500 MG tablet Take 1 tablet (500 mg total) by mouth 2 (two) times daily. One po bid x 7 days 12/08/13  Yes Nicole Pisciotta, PA-C  clindamycin (CLEOCIN) 150 MG capsule Take 3 capsules (450 mg total) by mouth 3 (three) times daily. 12/08/13  Yes Nicole Pisciotta, PA-C  insulin detemir (LEVEMIR) 100 UNIT/ML injection Inject 15-18 Units into the skin  at bedtime.    Yes Historical Provider, MD  insulin regular (NOVOLIN R,HUMULIN R) 100 units/mL injection Inject 18-20 Units into the skin 3 (three) times daily before meals. Sliding scale   Yes Historical Provider, MD  Omega-3 Fatty Acids (FISH OIL) 1200 MG CAPS Take 1,200 mg by mouth 2 (two) times daily.   Yes Historical Provider, MD   BP 137/73  Pulse 107  Temp(Src) 97.7 F (36.5 C) (Oral)  Resp 16  SpO2 99% Physical Exam  Nursing note and vitals reviewed. Constitutional: He is oriented to person, place, and time. He appears well-developed and well-nourished. No distress.  HENT:  Head: Normocephalic and atraumatic.  Mouth/Throat: Oropharynx is clear and moist.  Eyes: Pupils are equal, round, and reactive to light.  Neck: Normal range of motion. Neck supple.  Cardiovascular: Normal rate, regular rhythm and normal heart sounds.  Exam reveals no gallop and no friction rub.   No murmur heard. Pulmonary/Chest: Effort normal and breath sounds normal. No respiratory distress.  Musculoskeletal:       Feet:  Neurological: He is alert and oriented to person, place, and time.  Skin: Skin is warm and dry. No erythema.    ED Course  Procedures (including critical care time) Labs Review Labs Reviewed  COMPREHENSIVE METABOLIC PANEL - Abnormal; Notable for the following:    BUN 36 (*)    GFR calc non Af Amer 68 (*)    GFR calc Af Amer 79 (*)    All other components within normal limits  CBG MONITORING,  ED - Abnormal; Notable for the following:    Glucose-Capillary 15 (*)    All other components within normal limits  CBG MONITORING, ED - Abnormal; Notable for the following:    Glucose-Capillary 240 (*)    All other components within normal limits  CBG MONITORING, ED - Abnormal; Notable for the following:    Glucose-Capillary 246 (*)    All other components within normal limits  CBC WITH DIFFERENTIAL  URINALYSIS, ROUTINE W REFLEX MICROSCOPIC    Patient is pending at salicylate,  based on nursing assessment.  He may be depressed over his current medical conditions. Medically his wound looks better than previous    Carlyle DollyChristopher W Lawyer, PA-C 12/12/13 1556

## 2013-12-12 NOTE — BH Assessment (Signed)
Tele Assessment Note   Dale Hunter is an 48 y.o. male that was assessed this day via tele assessment by this clinician after receiving a call for a tele assessment.  Clinical information was gathered by Ivar Drapeob Browning, PA-C @ [phone] and pt's tele assessment scheduled for 1635 with pt's nurse, Marcelino DusterMichelle.  Per pt, he is in ED to get his antibiotics reevaluated.  Pt also stated that he has Diabetes and that when his sugar is low, he gets "happy and giddy."  Pt appeared somewhat bizarre, pausing for long periods during assessment after each question, stating, "I want to make sure I give correct information."  Pt admits to a longstanding hx of depression, reporting it runs in his family.  Pt stated that the only treatment he has had was counseling in college for depression. 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  Pt stated to ED staff that if his other leg were amputated, he would want to "leave the planet."  Pt admits to this, stating he has had thoughts of suicide, but could not articulate this during assessment and could not identify a plan to harm himself.  He stated he has never tried to harm himself before.  He endorses sx of depression including isolating from others, feeling helpless and hopeless, insomnia, feeling sad, and having a loss of interest in activities he once enjoyed.  He stated besides his medical issues, living at home with his parents is a stressor for him.  Per pt's parents, in the ED notes, pt is a Chartered loss adjusterhoarder.  Pt admitted that his parents going through his things upset him.  Pt also reported not being able to work, not being married, and having no children is also a stressor for him.  Pt was oriented x 4, appeared to have trouble forming thoughts, focusing, and concentrating, had soft speech, and  appropriate affect.  Pt denies HI or psychosis.  Pt denies any hx of SA.  However, this clinician is concerned because pt is unable to contract for safety at this time and reports his depressive sx have worsened a great deal.  He also reports ongoing SI.  Inpatient treatment recommended for stabilization of sx.  Consulted with Woodward KuJohn Winthrow, NP, who recommended pt remain in ED overnight for observation and [facility] in the AM @ 1735, with the possibility of discharge.  Called Rob Dripping SpringsBrowning, [place]-C @ [phone], who was in agreement with this disposition.  Updated ED and TTS staff.  Axis I: 296.33 Major Depressive Disorder, Recurrent, Severe Without Psychotic Features Axis II: Deferred Axis III:  Past Medical History  Diagnosis Date  . Diabetes mellitus without complication   . Osteomyelitis    Axis IV: housing problems, occupational problems, other psychosocial or environmental problems and problems related to social environment Axis V: 21-30 behavior considerably influenced by delusions or hallucinations OR serious impairment in judgment, communication OR inability to function in almost all areas  Past Medical History:  Past Medical History  Diagnosis Date  . Diabetes mellitus without complication   . Osteomyelitis     Past Surgical History  Procedure Laterality Date  . Below knee leg amputation      Family History: History reviewed. No pertinent family history.  Social History:  reports that he  has never smoked. He does not have any smokeless tobacco history on file. He reports that he does not drink alcohol or use illicit drugs.  Additional Social History:  Alcohol / Drug Use Pain Medications: see med list Prescriptions: see med list Over the Counter: see med list History of alcohol / drug use?: No history of alcohol / drug abuse Longest period of sobriety (when/how long):  (na) Negative Consequences of Use:  (na) Withdrawal Symptoms:  (na)  CIWA: CIWA-Ar BP: 130/68  mmHg Pulse Rate: 111 COWS:    Allergies:  Allergies  Allergen Reactions  . Advil [Ibuprofen] Rash    Home Medications:  (Not in a hospital admission)  OB/GYN Status:  No LMP for male patient.  General Assessment Data Location of Assessment: [facility] ED Is this a Tele or Face-to-Face Assessment?: Tele Assessment Is this an Initial Assessment or a Re-assessment for this encounter?: Initial Assessment Living Arrangements: Parent Can pt return to current living arrangement?: Yes Admission Status: Voluntary Is patient capable of signing voluntary admission?: Yes Transfer from: Acute Hospital Referral Source: Self/Family/Friend     [facility] Crisis Care Plan Living Arrangements: Parent Name of Psychiatrist: none Name of Therapist: none  Education Status Is patient currently in school?: No Highest grade of school patient has completed: College degree  Risk to self Suicidal Ideation: Yes-Currently Present Suicidal Intent: No Is patient at risk for suicide?: Yes Suicidal Plan?: No Access to Means: No What has been your use of drugs/alcohol within the last 12 months?: pt denies hx of SA Previous Attempts/Gestures: No How many times?: 0 Other Self Harm Risks: pt denies Triggers for Past Attempts: None known Intentional Self Injurious Behavior: None Family Suicide History: No Recent stressful life event(s): Recent negative physical changes;Other (Comment) (SI, depression, leg amputated in 03/2013) Persecutory voices/beliefs?: No Depression: Yes Depression Symptoms: Despondent;Insomnia;Isolating;Fatigue;Loss of interest in usual pleasures;Feeling worthless/self pity Substance abuse history and/or treatment for substance abuse?: No Suicide prevention information given to non-admitted patients: Not applicable  Risk to Others Homicidal Ideation: No Thoughts of Harm to Others: No Current Homicidal Intent: No Current Homicidal Plan: No Access to Homicidal Means: No Identified Victim:  na - pt denies History of harm to others?: No Assessment of Violence: None Noted Violent Behavior Description: na - pt calm, cooperative Does patient have access to weapons?: No Criminal Charges Pending?: No Does patient have a court date: No  Psychosis Hallucinations: None noted Delusions: None noted  Mental Status Report Appear/Hygiene: Disheveled Eye Contact: Good Motor Activity: Freedom of movement Speech: Logical/coherent;Slow Level of Consciousness: Alert Mood: Depressed;Anxious Affect: Appropriate to circumstance Anxiety Level: Moderate Thought Processes: Coherent;Relevant Judgement: Unimpaired Orientation: Person;Place;Time;Situation Obsessive Compulsive Thoughts/Behaviors:  (Pt is a 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)  Cognitive Functioning Concentration: Normal Memory: Recent Intact;Remote Intact IQ: Average Insight: Poor Impulse Control: Fair Appetite: Good Weight Loss:  (pt is unsure, but reports some weight loss) Weight Gain: 0 Sleep: No Change Total Hours of Sleep:  (varies since leg amputation) Vegetative Symptoms: None  ADLScreening [facility] Assessment Services) Patient's cognitive ability adequate to safely complete daily activities?: Yes Patient able to express need for assistance with ADLs?: Yes Independently performs ADLs?: Yes (appropriate for developmental age)  Prior Inpatient Therapy Prior Inpatient Therapy: No Prior Therapy Dates: na Prior Therapy Facilty/Provider(s): na Reason for Treatment: na  Prior Outpatient Therapy Prior Outpatient Therapy: Yes Prior Therapy Dates:  (Years ago when pt in college) Prior Therapy Facilty/Provider(s): Unknown counselors Reason for Treatment: Depression  ADL Screening (condition at time of admission)  Patient's cognitive ability adequate to safely complete daily activities?: Yes Is the patient deaf or have difficulty hearing?: No Does the patient have difficulty seeing, even when wearing glasses/contacts?: No Does  the patient have difficulty concentrating, remembering, or making decisions?: No Patient able to express need for assistance with ADLs?: Yes Does the patient have difficulty dressing or bathing?: Yes Independently performs ADLs?: Yes (appropriate for developmental age) Communication: Independent Is this a change from baseline?: Pre-admission baseline Dressing (OT): Needs assistance Is this a change from baseline?: Pre-admission baseline Grooming: Needs assistance Is this a change from baseline?: Pre-admission baseline Feeding: Independent Is this a change from baseline?: Pre-admission baseline Bathing: Needs assistance Is this a change from baseline?: Pre-admission baseline Toileting: Needs assistance Is this a change from baseline?: Pre-admission baseline In/Out Bed: Needs assistance Is this a change from baseline?: Pre-admission baseline Walks in Home: Needs assistance Is this a change from baseline?: Pre-admission baseline Does the patient have difficulty walking or climbing stairs?: Yes  Home Assistive Devices/Equipment Home Assistive Devices/Equipment: Wheelchair    Abuse/Neglect Assessment (Assessment to be complete while patient is alone) Physical Abuse: Denies Verbal Abuse: Yes, past (Comment) (In past, but did not articulate) Sexual Abuse: Denies Exploitation of patient/patient's resources: Denies Self-Neglect: Denies Values / Beliefs Cultural Requests During Hospitalization: None Spiritual Requests During Hospitalization: None Consults Spiritual Care Consult Needed: No Social Work Consult Needed: No Merchant navy officerAdvance Directives (For Healthcare) Advance Directive: Patient does not have advance directive;Patient would not like information Nutrition Screen- [facility] Adult/WL/AP Patient's home diet: Regular  Additional Information 1:1 In Past 12 Months?: No CIRT Risk: No Elopement Risk: No Does patient have medical clearance?: Yes     Disposition:  Disposition Initial  Assessment Completed for this Encounter: Yes Disposition of Patient: Other dispositions Other disposition(s): Other (Comment) (Pt to remain in ED overnight for observation/reassess in AM)  Casimer LaniusKristen Butler, MS, [facility] Licensed Professional Counselor Triage Specialist   12/12/2013 5:51 PM

## 2013-12-12 NOTE — ED Notes (Signed)
Pt knows that urine is needed. Pt does not have to void at this time 

## 2013-12-12 NOTE — ED Notes (Signed)
telepsych at bedside.

## 2013-12-12 NOTE — ED Notes (Signed)
Writer to room to get pt into paper scrubs pt had a hospital gown on and removed with paper scrub top placed on and pt had shorts on without pockets that pt says he wears as underwear with a depends Clinical research associatewriter searched shorts nothing present other that depends and paper scrub pants placed on pt

## 2013-12-12 NOTE — ED Notes (Signed)
Pt diaphoretic, laughing uncontrollably, mother states that this is how pt is when his sugar is too low-- CBG =15, IV started -- 22G in Right AC, 1 amp of D50 given-- father feeding applesauce, without difficulty, pt also drinking pomegranate  Juice from home.

## 2013-12-13 DIAGNOSIS — F4310 Post-traumatic stress disorder, unspecified: Secondary | ICD-10-CM

## 2013-12-13 DIAGNOSIS — F332 Major depressive disorder, recurrent severe without psychotic features: Secondary | ICD-10-CM

## 2013-12-13 LAB — CBG MONITORING, ED
GLUCOSE-CAPILLARY: 238 mg/dL — AB (ref 70–99)
Glucose-Capillary: 269 mg/dL — ABNORMAL HIGH (ref 70–99)
Glucose-Capillary: 194 mg/dL — ABNORMAL HIGH (ref 70–99)

## 2013-12-13 MED ORDER — INSULIN ASPART 100 UNIT/ML ~~LOC~~ SOLN
0.0000 [IU] | Freq: Three times a day (TID) | SUBCUTANEOUS | Status: DC
  Administered 2013-12-13: 3 [IU] via SUBCUTANEOUS
  Administered 2013-12-13: 8 [IU] via SUBCUTANEOUS
  Filled 2013-12-13: qty 1

## 2013-12-13 NOTE — ED Provider Notes (Signed)
Psych team indicates pt stable for d/c to home.  Pt alert, normal mood/affect. No SI.   Foot without sign acute infection. BS improved from prior.  Case manager to facilitate outpatient follow up.  Pt given resource guide for additional community mental health resources.  Pt appears stable for d/c.     Suzi RootsKevin E Steinl, MD 12/13/13 1426

## 2013-12-13 NOTE — ED Provider Notes (Signed)
Medical screening examination/treatment/procedure(s) were performed by non-physician practitioner and as supervising physician I was immediately available for consultation/collaboration.   EKG Interpretation None        Gilda Creasehristopher J. Jalonda Antigua, MD 12/13/13 1622

## 2013-12-13 NOTE — ED Notes (Addendum)
Pt alert, NAD, calm, interactive, resps e/u, speaking in clear complete sentences, skin W&D. (denies: pain, sob, nausea, dizziness, numbness, tingling, itching or other sx), denies need for sleep med. Updated with plan. Denies questions. 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

## 2013-12-13 NOTE — ED Provider Notes (Signed)
Medical screening examination/treatment/procedure(s) were performed by non-physician practitioner and as supervising physician I was immediately available for consultation/collaboration.   EKG Interpretation None       Courtney F Horton, MD 12/13/13 1927 

## 2013-12-13 NOTE — ED Notes (Signed)
Carb modified ordered 

## 2013-12-13 NOTE — ED Notes (Signed)
Mother called, wanting to know when pt will be discharged. Claris CheMargaret (mother)-- [phone] would like to be called upon discharge

## 2013-12-13 NOTE — Progress Notes (Signed)
CSW consult to pt regarding resources for outpt therapy and transportation. [place] CSW spoke with pt in regards to resources, pt was agreeable to referrals being made. CSW left message for Mrs. Harper,DSS [phone] to contact pt for transportation services. Pt/pt's mother given Mrs. Harper's information as well. [facility], Judithe ModestSusan Bond, therapist [phone] to schedule appt for therapy. CSW was told that had to call and schedule appt himself. CSW gave pt/pt.'s mother information. Pt to be reassessed by TTS to determine disposition.   [address]Doris Best, [place] [phone]

## 2013-12-13 NOTE — Consult Note (Signed)
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 Community-based outpatient treatment resources can include supportive psychotherapies in addition to the absolutely necessary home health nursing and care coordination services most important to stabilization of depression sufficiently to optimize medical management of acute and chronic medical needs. Patient is appreciative of but surprised by inclusion of mental health in his emergency department care psychiatrically prepared for return to the community.  Chauncey MannGlenn E. Jennings, MD

## 2013-12-13 NOTE — ED Notes (Signed)
Pt reports green stools as of recently. Asked if he should continue to take antibiotic. MD made aware. Pt should continue to take meds for treatment of cellulitis.

## 2013-12-13 NOTE — Discharge Instructions (Signed)
Follow up with your primary care doctor for recheck later this week. For behavioral health issues, coordinate with primary care doctor, and see resource guide for additional mental health resources in the community. Follow diabetic diet, monitor sugars close, record values, and follow up with your doctor this week. Return to ER if worse, new symptoms, fevers, severe depression, other concern.     Depression, Adult Depression refers to feeling sad, low, down in the dumps, blue, gloomy, or empty. In general, there are two kinds of depression: 1. Depression that we all experience from time to time because of upsetting life experiences, including the loss of a job or the ending of a relationship (normal sadness or normal grief). This kind of depression is considered normal, is short lived, and resolves within a few days to 2 weeks. (Depression experienced after the loss of a loved one is called bereavement. Bereavement often lasts longer than 2 weeks but normally gets better with time.) 2. Clinical depression, which lasts longer than normal sadness or normal grief or interferes with your ability to function at home, at work, and in school. It also interferes with your personal relationships. It affects almost every aspect of your life. Clinical depression is an illness. Symptoms of depression also can be caused by conditions other than normal sadness and grief or clinical depression. Examples of these conditions are listed as follows:  Physical illness--Some physical illnesses, including underactive thyroid gland (hypothyroidism), severe anemia, specific types of cancer, diabetes, uncontrolled seizures, heart and lung problems, strokes, and chronic pain are commonly associated with symptoms of depression.  Side effects of some prescription medicine--In some people, certain types of prescription medicine can cause symptoms of depression.  Substance abuse--Abuse of alcohol and illicit drugs can cause  symptoms of depression. SYMPTOMS Symptoms of normal sadness and normal grief include the following:  Feeling sad or crying for short periods of time.  Not caring about anything (apathy).  Difficulty sleeping or sleeping too much.  No longer able to enjoy the things you used to enjoy.  Desire to be by oneself all the time (social isolation).  Lack of energy or motivation.  Difficulty concentrating or remembering.  Change in appetite or weight.  Restlessness or agitation. Symptoms of clinical depression include the same symptoms of normal sadness or normal grief and also the following symptoms:  Feeling sad or crying all the time.  Feelings of guilt or worthlessness.  Feelings of hopelessness or helplessness.  Thoughts of suicide or the desire to harm yourself (suicidal ideation).  Loss of touch with reality (psychotic symptoms). Seeing or hearing things that are not real (hallucinations) or having false beliefs about your life or the people around you (delusions and paranoia). DIAGNOSIS  The diagnosis of clinical depression usually is based on the severity and duration of the symptoms. Your caregiver also will ask you questions about your medical history and substance use to find out if physical illness, use of prescription medicine, or substance abuse is causing your depression. Your caregiver also may order blood tests. TREATMENT  Typically, normal sadness and normal grief do not require treatment. However, sometimes antidepressant medicine is prescribed for bereavement to ease the depressive symptoms until they resolve. The treatment for clinical depression depends on the severity of your symptoms but typically includes antidepressant medicine, counseling with a mental health professional, or a combination of both. Your caregiver will help to determine what treatment is best for you. Depression caused by physical illness usually goes away with appropriate medical  treatment of  the illness. If prescription medicine is causing depression, talk with your caregiver about stopping the medicine, decreasing the dose, or substituting another medicine. Depression caused by abuse of alcohol or illicit drugs abuse goes away with abstinence from these substances. Some adults need professional help in order to stop drinking or using drugs. SEEK IMMEDIATE CARE IF:  You have thoughts about hurting yourself or others.  You lose touch with reality (have psychotic symptoms).  You are taking medicine for depression and have a serious side effect. FOR MORE INFORMATION National Alliance on Mental Illness: www.nami.[employer] of Mental Health: https://carter.com/ Document Released: 05/31/2000 Document Revised: 12/03/2011 Document Reviewed: 09/02/2011 [facility] Patient Information 2015 [place], [place]. This information is not intended to replace advice given to you by your health care provider. Make sure you discuss any questions you have with your health care provider.     Mood Disorders Mood disorders are conditions that affect the way a person feels emotionally. The main mood disorders include:  Depression.  Bipolar disorder.  Dysthymia. Dysthymia is a mild, lasting (chronic) depression. Symptoms of dysthymia are similar to depression, but not as severe.  Cyclothymia. Cyclothymia includes mood swings, but the highs and lows are not as severe as they are in bipolar disorder. Symptoms of cyclothymia are similar to those of bipolar disorder, but less extreme. CAUSES  Mood disorders are probably caused by a combination of factors. People with mood disorders seem to have physical and chemical changes in their brains. Mood disorders run in families, so there may be genetic causes. Severe trauma or stressful life events may also increase the risk of mood disorders.  SYMPTOMS  Symptoms of mood disorders depend on the specific type of condition. Depression symptoms  include:  Feeling sad, worthless, or hopeless.  Negative thoughts.  Inability to enjoy one's usual activities.  Low energy.  Sleeping too much or too little.  Appetite changes.  Crying.  Concentration problems.  Thoughts of harming oneself. Bipolar disorder symptoms include:  Periods of depression (see above symptoms).  Mood swings, from sadness and depression, to abnormal elation and excitement.  Periods of mania:  Racing thoughts.  Fast speech.  Poor judgment, and careless, dangerous choices.  Decreased need for sleep.  Risky behavior.  Difficulty concentrating.  Irritability.  Increased energy.  Increased sex drive. DIAGNOSIS  There are no blood tests or X-rays that can confirm a mood disorder. However, your caregiver may choose to run some tests to make sure that there is not another physical cause for your symptoms. A mood disorder is usually diagnosed after an in-depth interview with a caregiver. TREATMENT  Mood disorders can be treated with one or more of the following:  Medicine. This may include antidepressants, mood-stabilizers, or anti-psychotics.  Psychotherapy (talk therapy).  Cognitive behavioral therapy. You are taught to recognize negative thoughts and behavior patterns, and replace them with healthy thoughts and behaviors.  Electroconvulsive therapy. For very severe cases of deep depression, a series of treatments in which an electrical current is applied to the brain.  Vagus nerve stimulation. A pulse of electricity is applied to a portion of the brain.  Transcranial magnetic stimulation. Powerful magnets are placed on the head that produce electrical currents.  Hospitalization. In severe situations, or when someone is having serious thoughts of harming him or herself, hospitalization may be necessary in order to keep the person safe. This is also done to quickly start and monitor treatment. HOME CARE INSTRUCTIONS   Take your medicine  exactly as directed.  Attend all of your therapy sessions.  Try to eat regular, healthy meals.  Exercise daily. Exercise may improve mood symptoms.  Get good sleep.  Do not drink alcohol or use pot or other drugs. These can worsen mood symptoms and cause anxiety and psychosis.  Tell your caregiver if you develop any side effects, such as feeling sick to your stomach (nauseous), dry mouth, dizziness, constipation, drowsiness, tremor, weight gain, or sexual symptoms. He or she may suggest things you can do to improve symptoms.  Learn ways to cope with the stress of having a chronic illness. This includes yoga, meditation, tai chi, or participating in a support group.  Drink enough water to keep your urine clear or pale yellow. Eat a high-fiber diet. These habits may help you avoid constipation from your medicine. SEEK IMMEDIATE MEDICAL CARE IF:  Your mood worsens.  You have thoughts of hurting yourself or others.  You cannot care for yourself.  You develop the sensation of hearing or seeing something that is not actually present (auditory or visual hallucinations).  You develop abnormal thoughts. Document Released: 03/31/2009 Document Revised: 08/26/2011 Document Reviewed: 03/31/2009 [facility] Patient Information 2015 [place], [place]. This information is not intended to replace advice given to you by your health care provider. Make sure you discuss any questions you have with your health care provider.   Hyperglycemia Hyperglycemia occurs when the glucose (sugar) in your blood is too high. Hyperglycemia can happen for many reasons, but it most often happens to people who do not know they have diabetes or are not managing their diabetes properly.  CAUSES  Whether you have diabetes or not, there are other causes of hyperglycemia. Hyperglycemia can occur when you have diabetes, but it can also occur in other situations that you might not be as aware of, such as: Diabetes  If you have  diabetes and are having problems controlling your blood glucose, hyperglycemia could occur because of some of the following reasons:  Not following your meal plan.  Not taking your diabetes medications or not taking it properly.  Exercising less or doing less activity than you normally do.  Being sick. Pre-diabetes  This cannot be ignored. Before people develop Type 2 diabetes, they almost always have "pre-diabetes." This is when your blood glucose levels are higher than normal, but not yet high enough to be diagnosed as diabetes. Research has shown that some long-term damage to the body, especially the heart and circulatory system, may already be occurring during pre-diabetes. If you take action to manage your blood glucose when you have pre-diabetes, you may delay or prevent Type 2 diabetes from developing. Stress  If you have diabetes, you may be "diet" controlled or on oral medications or insulin to control your diabetes. However, you may find that your blood glucose is higher than usual in the hospital whether you have diabetes or not. This is often referred to as "stress hyperglycemia." Stress can elevate your blood glucose. This happens because of hormones put out by the body during times of stress. If stress has been the cause of your high blood glucose, it can be followed regularly by your caregiver. That way he/she can make sure your hyperglycemia does not continue to get worse or progress to diabetes. Steroids  Steroids are medications that act on the infection fighting system (immune system) to block inflammation or infection. One side effect can be a rise in blood glucose. Most people can produce enough extra insulin to allow  for this rise, but for those who cannot, steroids make blood glucose levels go even higher. It is not unusual for steroid treatments to "uncover" diabetes that is developing. It is not always possible to determine if the hyperglycemia will go away after the  steroids are stopped. A special blood test called an A1c is sometimes done to determine if your blood glucose was elevated before the steroids were started. SYMPTOMS  Thirsty.  Frequent urination.  Dry mouth.  Blurred vision.  Tired or fatigue.  Weakness.  Sleepy.  Tingling in feet or leg. DIAGNOSIS  Diagnosis is made by monitoring blood glucose in one or all of the following ways:  A1c test. This is a chemical found in your blood.  Fingerstick blood glucose monitoring.  Laboratory results. TREATMENT  First, knowing the cause of the hyperglycemia is important before the hyperglycemia can be treated. Treatment may include, but is not be limited to:  Education.  Change or adjustment in medications.  Change or adjustment in meal plan.  Treatment for an illness, infection, etc.  More frequent blood glucose monitoring.  Change in exercise plan.  Decreasing or stopping steroids.  Lifestyle changes. HOME CARE INSTRUCTIONS   Test your blood glucose as directed.  Exercise regularly. Your caregiver will give you instructions about exercise. Pre-diabetes or diabetes which comes on with stress is helped by exercising.  Eat wholesome, balanced meals. Eat often and at regular, fixed times. Your caregiver or nutritionist will give you a meal plan to guide your sugar intake.  Being at an ideal weight is important. If needed, losing as little as 10 to 15 pounds may help improve blood glucose levels. SEEK MEDICAL CARE IF:   You have questions about medicine, activity, or diet.  You continue to have symptoms (problems such as increased thirst, urination, or weight gain). SEEK IMMEDIATE MEDICAL CARE IF:   You are vomiting or have diarrhea.  Your breath smells fruity.  You are breathing faster or slower.  You are very sleepy or incoherent.  You have numbness, tingling, or pain in your feet or hands.  You have chest pain.  Your symptoms get worse even though you have  been following your caregiver's orders.  If you have any other questions or concerns. Document Released: 11/27/2000 Document Revised: 08/26/2011 Document Reviewed: 09/30/2011 [facility] Patient Information 2015 [place], [place]. This information is not intended to replace advice given to you by your health care provider. Make sure you discuss any questions you have with your health care provider.     Emergency Department Resource Guide 1) Find a Doctor and Pay Out of Pocket Although you won't have to find out who is covered by your insurance plan, it is a good idea to ask around and get recommendations. You will then need to call the office and see if the doctor you have chosen will accept you as a new patient and what types of options they offer for patients who are self-pay. Some doctors offer discounts or will set up payment plans for their patients who do not have insurance, but you will need to ask so you aren't surprised when you get to your appointment.  2) Contact Your Local Health Department Not all health departments have doctors that can see patients for sick visits, but many do, so it is worth a call to see if yours does. If you don't know where your local health department is, you can check in your phone book. The CDC also has a tool to  help you locate your state's health department, and many state websites also have listings of all of their local health departments.  3) Find a [facility] If your illness is not likely to be very severe or complicated, you may want to try a walk in clinic. These are popping up all over the country in pharmacies, drugstores, and shopping centers. They're usually staffed by nurse practitioners or physician assistants that have been trained to treat common illnesses and complaints. They're usually fairly quick and inexpensive. However, if you have serious medical issues or chronic medical problems, these are probably not your best option.  No Primary  Care Doctor: - Call Health Connect at  [phone] - they can help you locate a primary care doctor that  accepts your insurance, provides certain services, etc. - Physician Referral Service- [phone]  Chronic Pain Problems: Organization         Address  Phone   Notes  [facility]  [phone] Patients need to be referred by their primary care doctor.   Medication Assistance: Organization         Address  Phone   Notes  [facility] Medication [facility] [place] [place], [place] [address] [phone] --Must be a resident of [facility] -- Must have NO insurance coverage whatsoever (no Medicaid/ Medicare, etc.) -- The pt. MUST have a primary care doctor that directs their care regularly and follows them in the community   [facility]ist  [phone]   [employer]  [phone]    Agencies that provide inexpensive medical care: Organization         Address  Phone   Notes  [place]  [phone]   [facility]    [phone]   [facility] [place],  [address] [phone]   [place] [address], [place] [phone]   Planned Parenthood    [phone]   [facility]    [phone]   [place] [address], [place] Phone:  [phone], Fax:  [phone] Hours of Operation:  9 am - 6 pm, M-F.  Also accepts Medicaid/Medicare and self-pay.  [facility] for [place], [address], [place] Phone: [phone], Fax: [phone]. Hours of Operation:  8:30 am - 5:30 pm, M-F.  Also accepts Medicaid and self-pay.  [facility] [place] [address] [place] Phone: [phone]   [place], [place], [place] [phone], Ext. [phone] Mondays & Thursdays: 7-9 AM.  First 15 patients are seen on a first  come, first serve basis.    [place] Providers:  Organization         Address  Phone   Notes  [facility] [address], [place] [phone] Also accepts self-pay patients.  [facility] [address], [place]  [phone]   [place], [address], [place] [phone]   [facility] [address] [place] [phone]   Lucianne Lei [address], [place]   [phone] Only accepts [place] patients after they have their name applied to their card.   Self-Pay (no insurance) in [facility]:  Organization  Address  Phone   Notes  Sickle Cell Patients, [facility] [place] [phone]   [facility] [place] [phone]   [facility] [place]  [place], [address], [place] [phone]   [facility]/Dr. Osei-Bonsu  [address], [place] or [address], [place] [phone] Phone number for both [place] and [place] locations is the same.  Urgent Medical and [facility] [address] [place] [phone]   [facility] ([place]) [address], [place] or [address] [phone]   [facility] [address] [place] [phone], phone; [phone], fax Sees patients 1st and 3rd Saturday of every month.  Must not qualify for public or private insurance (i.e. Medicaid, Medicare, [place] Health Choice, Veterans' Benefits)  Household income should be no more than 200% of the poverty level The clinic cannot treat you if you are pregnant or think you are pregnant  Sexually transmitted diseases are not treated at the clinic.    Dental Care: Organization          Address  Phone  Notes  [facility] Department of [facility] [place] [phone] Accepts children up to age 57 who are enrolled in [place] or [place]; pregnant women with a Medicaid card; and children who have applied for Medicaid or [place] Health Choice, but were declined, whose parents can pay a reduced fee at time of service.  [facility] Department of [facility]  [address], [place] [phone] Accepts children up to age 67 who are enrolled in [place] or [place]; pregnant women with a Medicaid card; and children who have applied for Medicaid or [place] Health Choice, but were declined, whose parents can pay a reduced fee at time of service.  [place] Adult [facility] Access PRO[facility]  [place] [phone] Patients are seen by appointment only. Walk-ins are not accepted. [place] will see patients 49 years of age and older. Monday - Tuesday (8am-5pm) Most Wednesdays (8:30-5pm) $30 per visit, cash only  [facility] Adult Dental Access PROGRAM  [address], [facility] [phone] Patients are seen by appointment only. Walk-ins are not accepted. [place] will see patients 38 years of age and older. One Wednesday Evening (Monthly: Volunteer Based).  $30 per visit, cash only  [place]  [phone] for adults; Children under age 59, call Graduate Pediatric Dentistry at [phone]. Children aged 77-14, please call [phone] to request a pediatric application.  Dental services are provided in all areas of dental care including fillings, crowns and bridges, complete and partial dentures, implants, gum treatment, root canals, and extractions. Preventive care is also provided. Treatment is provided to both adults and children. Patients are selected via a lottery and there is often a waiting list.   [facility] [address], [place]  [phone] [email or web address]   [facility] [address] [place], [place] [phone], Ext. [phone] Second and Fourth Thursday of each month, opens at 6:30 AM; Clinic ends at 9 AM.  Patients are seen on a first-come first-served basis, and a limited number are seen during each clinic.   [facility]  [address] [place], [place]  [place], [place] [phone]   Eligibility Requirements You must have lived in [place], [place], or [place] counties for at least the last three months.   You cannot be eligible for state or federal sponsored [employer], including [employer], [place], or [employer].   You generally cannot be eligible for healthcare insurance through your employer.    How to apply: Eligibility screenings are held every Tuesday and Wednesday afternoon from 1:00 pm until 4:00 pm. You do not need an appointment for the interview!  [facility] [address], [place], [place]   [place] Department  [place]  [phone]    Behavioral Health Resources in the Community: Intensive Outpatient Programs Organization         Address  Phone  Notes  [place]. [address], [place], [place] [phone]   [facility] [address] [place], [place]   ADS: Alcohol & Drug [facility] [address] [place], [place]   [place] [address]  [place], [place] or [phone]   Substance Abuse Resources Organization         Address  Phone  Notes  Alcohol and Drug Services  [phone]   [place]  [phone]   [facility] [place]   Chinita Pester  [phone]   Residential & Outpatient Substance Abuse Program  [phone]   Psychological  Services Organization         Address  Phone  Notes  [facility] [place]  [place]  [phone]   [place] [address] [place] or [phone]    Mobile Crisis Teams Organization         Address  Phone  Notes  [facility], Mobile Crisis Care Unit  [phone]   [facility]  [address] [place], [place]   [place] [address], [place] [phone]    Self-Help/Support Groups Organization         Address  Phone             Notes  [place]. of [place] - variety of support groups  [place] Call for more information  Narcotics Anonymous (NA), [facility] [address], [employer] [place]  2 meetings at this location   Special educational needs teacher         Address  Phone  Notes  [facility]idential [facility] [place],    [place]  [phone]   [facility]  [address] [place] [id number], [place], [place]   [place], [place] [phone] Admissions: 8am-3pm M-F  Incentives Substance [place] [address],    [place], [place] [phone]   [facility] [address] Jadene Pierini [place], [place]   [facility] [address],  [place], [place]   [facility] [address] [address], [place], [place]   [facility] ([facility] [place]  [place], [place] or [phone]   [facility] [address], [place], [place] Accepts Medicaid  [facility] [address],  [place] [phone] Substance Abuse/Addiction Treatment   [facility]  Address  Phone  Notes  [facility]  [phone]   Domenic Schwab, PhD [address] Arlis [place], [place]   [phone] or [phone]   [place], [place] [phone]   [address], [place], [place] [phone] Insurance/Medicaid/sponsorship through [facility] and Families [address] [place]                                    [place], [place] [phone] [place] [address], [place] [phone]    Dr. Adele Schilder  [phone]   [facility] Dept. 1) [address], [place] 2) [place] 3)  [address], [place] [phone]  [phone]   [place] [phone] or [phone] (After Hours)

## 2013-12-13 NOTE — ED Notes (Addendum)
Pt alert, NAD, calm, interactive, resps e/u, sitter at BS, no changes. Denies needs.   

## 2013-12-13 NOTE — ED Notes (Signed)
Pt alert, NAD, calm, interactive, resps e/u, sitter at BS, no changes. Denies needs.   

## 2013-12-13 NOTE — Progress Notes (Signed)
  CARE MANAGEMENT ED NOTE 12/13/2013  Patient:  Dale BattlesBARON,Jkwon   Account Number:  [id number]  Date Initiated:  12/13/2013  Documentation initiated by:  Ferdinand CavaSCHETTINO,ANDREA  Subjective/Objective Assessment:   48 yo male presenting to the ED with depression and cellulitis     Subjective/Objective Assessment Detail:   HPI  Patient presents to the emergency department with recheck of his cellulitis of his right foot.  Patient, states, that the antibiotics may be causing him to have diarrhea. Patient, states, it that's bothersome to him.  Patient denies chest pain, shortness of breath, nausea, vomiting, headache, blurred vision, weakness, dizziness, back pain, fever, or syncope.  Patient, states, that nothing seems to make his condition, better or worse.  Patient is not the best historian.  I did obtain some history from his parents     Action/Plan:   Patient is to follow up with his PCP and if he wants to change PCPs then he knows the process to establish care with new Medicaid PCP.   Action/Plan Detail:   Anticipated DC Date:       Status Recommendation to Physician:   Result of Recommendation:  Agreed    DC Planning Services  CM consult  PCP issues  Other    Choice offered to / List presented to:  C-1 Patient          Status of service:    ED Comments:   ED Comments Detail:  CM spoke with the patient regarding PCP concerns. The patient stated that he has not been compliant with his DM regimen in the past and is now "suffering the consequences". The patient stated that he has [facility] [place] but it is too far to a ride and would like something closer to home. This CM provided the patient with a list of Medicaid accepting PCP's and explained that this CM can help establish care with another practice. The patient stated that he prefers to go to an endocrinologist. This CM explained that he needs to establish care with a PCP and get  referred to an endocrinologist to better manage his DM but that he will need to continue to follow with a PCP for his general medical care. The patient verbalized understanding and stated that he prefers to look over the list prior to changing PCP's and he stated that he understands the process. No further questions or concerns.

## 2013-12-13 NOTE — ED Notes (Signed)
Pt alert, NAD, calm, interactive, resps e/u, [facility], no changes. Denies needs.

## 2013-12-13 NOTE — Consult Note (Signed)
Telepsych Consultation   Reason for Consult:  Referral for tele-psych Referring Physician:  EDP/Browning PA-C Urias Sheek is an 48 y.o. male.  Assessment: AXIS I:  Major Depression, Recurrent severe and Post Traumatic Stress Disorder AXIS II:  Deferred AXIS III:   Past Medical History  Diagnosis Date  . Diabetes mellitus without complication   . Osteomyelitis    AXIS IV:  economic problems, housing problems, other psychosocial or environmental problems, problems with access to health care services and problems with primary support group AXIS V:  51-60 moderate symptoms  Plan:  Outpatient referrals to psychiatry and therapy.   Subjective:   Dale Hunter is a 48 y.o. male patient presenting to [facility] for reported adjustment of antibiotics for his leg infection. Pt has reported ongoing depression secondary to his health conditions. When asked about his insulin dosing, he stated that he "overestimated his dosage". Pt appears to be genuine in his statements and through further questioning, pt appears to be very uneducated in regard to his diabetic care and medication managements. Pt is slow to respond and appears to be processing information for extended periods of time; consider possible borderline intellectual capacity. Pt denies SI, HI, and AVH, contracts for safety and reports a good supportive home environment living with both parents. Pt in agreement to outpatient therapy and psychiatry to manage his ongoing depression and anxiety secondary to life stressors, particularly medical conditions and loss of limb.   HPI:  Dale Hunter is a 48 y.o. male patient who [facility] for recheck of cellulitis to his right foot. Patient was deemed appropriate for discharge to home medically, but there was concern about patient being depressed, and possibly suicidal and a consult to TTS was ordered. Patient has been assessed by Shaune Pascal, LPC earlier today. Tele-psych ordered for possible  discharge home. Patient reports that he came to the ED for re-evaluation of the antibiotics he is for an infection in his ankle. 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 Patient states that now he is facing the possibility of having his right foot amputated and this is very stressful for the patient. He reports he has a PMH of Diabetes Type I, Depression and Charcot foot. Patient endorses that he is depressed currently; rates his depression 5/10. He currently denies suicidal/homicidal ideation, intent or plan. He denies AVH. Patient was asked if he felt safe going home to which he answered after a long pause "no, I can't get out 'cause I'm stuck in a wheelchair." Patient states he has to depend on his father to drive him everywhere. Patient currently lives with his parents and he states he is not okay living with his parents. Patient describes his current living situation as "a dark house." He states he is an only child and that he is concerned because his mother is disabled also and having the amputation "put a dent in being able to take her places." After a long pause, patient goes on the state that another concern of his is "I was so overwhelmed with everything that happened that I wasn't able to complete physical therapy January-April." "I was battling in my own mind and it was hard to stay focused; it's still hard to stay focused." Patient states that he has never talked with anyone about his depression nor has he been on medications for depression. Patient states he never talked with anyone after his amputation other than family friends.  Patient obsesses about about his legs and the current state of his right leg. Patient states he feels he needs to talk to someone but states "don't see what good it would do at this point; even talking to someone won't bring my legs back." Patient states he doesn't sleep well, averaging "maybe 6 hours at night";  states he has trouble falling asleep and staying asleep. Patient also endorses that he feels helpless and at times hopeless and that he is not as motivated as he once was. During assessment, patient would pause for long periods and exhibited decreased concentration.   HPI Elements:   Location:  Mood. Quality:  Depressed. Severity:  Moderate. Timing:  Onset 03/2013 after amputation. Duration:  Worsening since 05/2013. Context:  Medical condition; Left BKA, right ankle amputation.  Past Psychiatric History: Past Medical History  Diagnosis Date  . Diabetes mellitus without complication   . Osteomyelitis     reports that he has never smoked. He does not have any smokeless tobacco history on file. He reports that he does not drink alcohol or use illicit drugs. History reviewed. No pertinent family history. Family History Substance Abuse: Yes, Describe: (ETOH on father's side) Family Supports: Yes, List: (parents) Living Arrangements: Parent Can pt return to current living arrangement?: Yes Allergies:   Allergies  Allergen Reactions  . Advil [Ibuprofen] Rash    ACT Assessment Complete:  Yes:    Educational Status    Risk to Self: Risk to self Suicidal Ideation: Yes-Currently Present Suicidal Intent: No Is patient at risk for suicide?: Yes Suicidal Plan?: No Access to Means: No What has been your use of drugs/alcohol within the last 12 months?: pt denies hx of SA Previous Attempts/Gestures: No How many times?: 0 Other Self Harm Risks: pt denies Triggers for Past Attempts: None known Intentional Self Injurious Behavior: None Family Suicide History: No Recent stressful life event(s): Recent negative physical changes;Other (Comment) (SI, depression, leg amputated in 03/2013) Persecutory voices/beliefs?: No Depression: Yes Depression Symptoms: Despondent;Insomnia;Isolating;Fatigue;Loss of interest in usual pleasures;Feeling worthless/self pity Substance abuse history and/or  treatment for substance abuse?: No Suicide prevention information given to non-admitted patients: Not applicable  Risk to Others: Risk to Others Homicidal Ideation: No Thoughts of Harm to Others: No Current Homicidal Intent: No Current Homicidal Plan: No Access to Homicidal Means: No Identified Victim: na - pt denies History of harm to others?: No Assessment of Violence: None Noted Violent Behavior Description: na - pt calm, cooperative Does patient have access to weapons?: No Criminal Charges Pending?: No Does patient have a court date: No  Abuse: Abuse/Neglect Assessment (Assessment to be complete while patient is alone) Physical Abuse: Denies Verbal Abuse: Yes, past (Comment) (In past, but did not articulate) Sexual Abuse: Denies Exploitation of patient/patient's resources: Denies Self-Neglect: Denies  Prior Inpatient Therapy: Prior Inpatient Therapy Prior Inpatient Therapy: No Prior Therapy Dates: na Prior Therapy Facilty/Provider(s): na Reason for Treatment: na  Prior Outpatient Therapy: Prior Outpatient Therapy Prior Outpatient Therapy: Yes Prior Therapy Dates:  (Years ago when pt in college) Prior Therapy Facilty/Provider(s): Unknown counselors Reason for Treatment: Depression  Additional Information: Additional Information 1:1 In Past 12 Months?: No CIRT Risk: No Elopement Risk: No Does patient have medical clearance?: Yes    Objective: Blood pressure 126/77, pulse 106, temperature 98.1 F (36.7 C), temperature source Oral, resp. rate 22, SpO2 99.00%.There is no height or weight on file to calculate BMI. Results for orders placed during the hospital encounter of 12/12/13 (from the past 72 hour(s))  CBC WITH DIFFERENTIAL     Status: None   Collection Time    12/12/13 12:40 PM      Result Value Ref Range   WBC 8.7  4.0 - 10.5 K/uL   RBC 4.60  4.22 - 5.81 MIL/uL   Hemoglobin 13.4  13.0 - 17.0 g/dL   HCT 39.0  39.0 - 52.0 %   MCV 84.8  78.0 - 100.0 fL   MCH 29.1   26.0 - 34.0 pg   MCHC 34.4  30.0 - 36.0 g/dL   RDW 13.4  11.5 - 15.5 %   Platelets 263  150 - 400 K/uL   Neutrophils Relative % 66  43 - 77 %   Neutro Abs 5.8  1.7 - 7.7 K/uL   Lymphocytes Relative 26  12 - 46 %   Lymphs Abs 2.2  0.7 - 4.0 K/uL   Monocytes Relative 6  3 - 12 %   Monocytes Absolute 0.5  0.1 - 1.0 K/uL   Eosinophils Relative 1  0 - 5 %   Eosinophils Absolute 0.1  0.0 - 0.7 K/uL   Basophils Relative 1  0 - 1 %   Basophils Absolute 0.1  0.0 - 0.1 K/uL  COMPREHENSIVE METABOLIC PANEL     Status: Abnormal   Collection Time    12/12/13 12:40 PM      Result Value Ref Range   Sodium 137  137 - 147 mEq/L   Potassium 4.3  3.7 - 5.3 mEq/L   Chloride 97  96 - 112 mEq/L   CO2 24  19 - 32 mEq/L   Glucose, Bld 91  70 - 99 mg/dL   BUN 36 (*) 6 - 23 mg/dL   Creatinine, Ser 1.24  0.50 - 1.35 mg/dL   Calcium 9.6  8.4 - 10.5 mg/dL   Total Protein 7.2  6.0 - 8.3 g/dL   Albumin 3.5  3.5 - 5.2 g/dL   AST 27  0 - 37 U/L   ALT 30  0 - 53 U/L   Alkaline Phosphatase 77  39 - 117 U/L   Total Bilirubin 0.4  0.3 - 1.2 mg/dL   GFR calc non Af Amer 68 (*) >90 mL/min   GFR calc Af Amer 79 (*) >90 mL/min   Comment: (NOTE)     The eGFR has been calculated using the CKD EPI equation.     This calculation has not been validated in all clinical situations.     eGFR's persistently <90 mL/min signify possible Chronic Kidney     Disease.  CBG MONITORING, ED     Status: Abnormal   Collection Time    12/12/13  2:35 PM      Result Value Ref Range   Glucose-Capillary 15 (*) 70 - 99 mg/dL  CBG MONITORING, ED     Status: Abnormal   Collection Time    12/12/13  2:50 PM      Result Value Ref Range   Glucose-Capillary 240 (*) 70 - 99 mg/dL  CBG MONITORING, ED     Status: Abnormal   Collection Time    12/12/13  3:36 PM      Result Value Ref Range   Glucose-Capillary 246 (*) 70 - 99 mg/dL  URINALYSIS, ROUTINE W REFLEX MICROSCOPIC     Status: Abnormal   Collection Time    12/12/13  5:26 PM       Result Value Ref Range   Color, Urine YELLOW  YELLOW  APPearance CLEAR  CLEAR   Specific Gravity, Urine 1.016  1.005 - 1.030   pH 5.0  5.0 - 8.0   Glucose, UA 500 (*) NEGATIVE mg/dL   Hgb urine dipstick SMALL (*) NEGATIVE   Bilirubin Urine NEGATIVE  NEGATIVE   Ketones, ur NEGATIVE  NEGATIVE mg/dL   Protein, ur 30 (*) NEGATIVE mg/dL   Urobilinogen, UA 0.2  0.0 - 1.0 mg/dL   Nitrite NEGATIVE  NEGATIVE   Leukocytes, UA NEGATIVE  NEGATIVE  URINE MICROSCOPIC-ADD ON     Status: None   Collection Time    12/12/13  5:26 PM      Result Value Ref Range   Squamous Epithelial / LPF RARE  RARE   RBC / HPF 0-2  <3 RBC/hpf  CBG MONITORING, ED     Status: Abnormal   Collection Time    12/12/13  6:03 PM      Result Value Ref Range   Glucose-Capillary 411 (*) 70 - 99 mg/dL  CBG MONITORING, ED     Status: Abnormal   Collection Time    12/12/13  8:06 PM      Result Value Ref Range   Glucose-Capillary 484 (*) 70 - 99 mg/dL  CBG MONITORING, ED     Status: Abnormal   Collection Time    12/13/13  8:41 AM      Result Value Ref Range   Glucose-Capillary 269 (*) 70 - 99 mg/dL   Labs are reviewed and are pertinent for glucose 411.  Current Facility-Administered Medications  Medication Dose Route Frequency Provider Last Rate Last Dose  . cholecalciferol (VITAMIN D) tablet 4,000 Units  4,000 Units Oral Daily Montine Circle, PA-C   4,000 Units at 12/13/13 1103  . clindamycin (CLEOCIN) capsule 450 mg  450 mg Oral TID Montine Circle, PA-C   450 mg at 12/13/13 1103  . insulin aspart (novoLOG) injection 0-15 Units  0-15 Units Subcutaneous TID WC Montine Circle, PA-C   8 Units at 12/13/13 [phone]  . insulin aspart (novoLOG) injection 15-18 Units  15-18 Units Subcutaneous TID WC Montine Circle, PA-C   13 Units at 12/12/13 2030  . insulin detemir (LEVEMIR) injection 15 Units  15 Units Subcutaneous QHS Montine Circle, PA-C   15 Units at 12/13/13 5038   Current Outpatient Prescriptions  Medication Sig  Dispense Refill  . Cholecalciferol (VITAMIN D3) 2000 UNITS TABS Take 4,000 Units by mouth daily.      . ciprofloxacin (CIPRO) 500 MG tablet Take 1 tablet (500 mg total) by mouth 2 (two) times daily. One po bid x 7 days  24 tablet  0  . clindamycin (CLEOCIN) 150 MG capsule Take 3 capsules (450 mg total) by mouth 3 (three) times daily.  126 capsule  0  . insulin detemir (LEVEMIR) 100 UNIT/ML injection Inject 15-18 Units into the skin at bedtime.       . insulin regular (NOVOLIN R,HUMULIN R) 100 units/mL injection Inject 18-20 Units into the skin 3 (three) times daily before meals. Sliding scale      . Omega-3 Fatty Acids (FISH OIL) 1200 MG CAPS Take 1,200 mg by mouth 2 (two) times daily.        Psychiatric Specialty Exam:     Blood pressure 126/77, pulse 106, temperature 98.1 F (36.7 C), temperature source Oral, resp. rate 22, SpO2 99.00%.There is no height or weight on file to calculate BMI.  General Appearance: Disheveled  Eye Contact::  Poor  Speech:  Clear and Coherent and Slow  Volume:  Normal  Mood:  Depressed  Affect:  Flat  Thought Process:  Circumstantial  Orientation:  Full (Time, Place, and Person)  Thought Content:  WDL  Suicidal Thoughts:  No  Homicidal Thoughts:  No  Memory:  Immediate;   Good Recent;   Good Remote;   Fair  Judgement:  Intact  Insight:  Lacking  Psychomotor Activity:  Normal  Concentration:  Fair  Recall:  Fair  Akathisia:  No  Handed:  Right  AIMS (if indicated):     Assets:  Communication Skills Desire for Improvement Housing  Sleep:      Treatment Plan Summary: -[facility] TTS to assist with outpatient referrals for psychiatry and therapy regarding management of anxiety/depression. -Social work to assist in referral to Endocrinology outpatient ASAP regarding diabetes education -ED nurse to provide diabetic educational materials (if available, please).   Disposition: See above Disposition Initial Assessment Completed for this Encounter:  Yes Disposition of Patient: Other dispositions Other disposition(s): Other Benjamine Mola, FNP-BC 12/13/2013 11:57 AM

## 2013-12-13 NOTE — ED Notes (Signed)
Pt has a far away stare, but answering appropriately to questions, diaphoretic, clammy skin.

## 2013-12-13 NOTE — BH Assessment (Signed)
DISPOSITION  Per Alberteen SamFran Hobson, NP on 12/12/13: "1. Recommend patient remain in ED overnight; re-evaluation in AM by psychiatrist  2. Consult social work to assist patient with establishing Medicaid transportation and facilitating outpatient follow-up for behavioral health therapy."   Harlin RainFord Ellis Ria CommentWarrick Jr, LPC, [facility] Triage Specialist [phone]

## 2013-12-15 LAB — CULTURE, BLOOD (ROUTINE X 2)
CULTURE: NO GROWTH
Culture: NO GROWTH

## 2014-11-15 ENCOUNTER — Inpatient Hospital Stay (HOSPITAL_COMMUNITY): Payer: Medicaid Other

## 2014-11-15 ENCOUNTER — Inpatient Hospital Stay (HOSPITAL_COMMUNITY)
Admission: EM | Admit: 2014-11-15 | Discharge: 2014-12-07 | DRG: 917 | Disposition: A | Discharge status: Home Health | Payer: Medicaid Other | Attending: Internal Medicine | Admitting: Internal Medicine

## 2014-11-15 ENCOUNTER — Other Ambulatory Visit (HOSPITAL_COMMUNITY): Payer: Self-pay

## 2014-11-15 ENCOUNTER — Encounter (HOSPITAL_COMMUNITY): Payer: Self-pay | Admitting: *Deleted

## 2014-11-15 DIAGNOSIS — Z9289 Personal history of other medical treatment: Secondary | ICD-10-CM

## 2014-11-15 DIAGNOSIS — T383X1A Poisoning by insulin and oral hypoglycemic [antidiabetic] drugs, accidental (unintentional), initial encounter: Secondary | ICD-10-CM | POA: Diagnosis present

## 2014-11-15 DIAGNOSIS — J9690 Respiratory failure, unspecified, unspecified whether with hypoxia or hypercapnia: Secondary | ICD-10-CM

## 2014-11-15 DIAGNOSIS — E119 Type 2 diabetes mellitus without complications: Secondary | ICD-10-CM

## 2014-11-15 DIAGNOSIS — R45850 Homicidal ideations: Secondary | ICD-10-CM | POA: Diagnosis present

## 2014-11-15 DIAGNOSIS — N179 Acute kidney failure, unspecified: Secondary | ICD-10-CM | POA: Diagnosis present

## 2014-11-15 DIAGNOSIS — J811 Chronic pulmonary edema: Secondary | ICD-10-CM | POA: Diagnosis present

## 2014-11-15 DIAGNOSIS — M6289 Other specified disorders of muscle: Secondary | ICD-10-CM | POA: Diagnosis not present

## 2014-11-15 DIAGNOSIS — L89519 Pressure ulcer of right ankle, unspecified stage: Secondary | ICD-10-CM | POA: Diagnosis present

## 2014-11-15 DIAGNOSIS — R569 Unspecified convulsions: Secondary | ICD-10-CM | POA: Diagnosis present

## 2014-11-15 DIAGNOSIS — L8990 Pressure ulcer of unspecified site, unspecified stage: Secondary | ICD-10-CM | POA: Insufficient documentation

## 2014-11-15 DIAGNOSIS — Z886 Allergy status to analgesic agent status: Secondary | ICD-10-CM | POA: Diagnosis not present

## 2014-11-15 DIAGNOSIS — F200 Paranoid schizophrenia: Secondary | ICD-10-CM | POA: Diagnosis not present

## 2014-11-15 DIAGNOSIS — D696 Thrombocytopenia, unspecified: Secondary | ICD-10-CM | POA: Diagnosis present

## 2014-11-15 DIAGNOSIS — Z6821 Body mass index (BMI) 21.0-21.9, adult: Secondary | ICD-10-CM | POA: Diagnosis not present

## 2014-11-15 DIAGNOSIS — G9340 Encephalopathy, unspecified: Secondary | ICD-10-CM | POA: Diagnosis not present

## 2014-11-15 DIAGNOSIS — G210 Malignant neuroleptic syndrome: Secondary | ICD-10-CM | POA: Diagnosis not present

## 2014-11-15 DIAGNOSIS — A4153 Sepsis due to Serratia: Secondary | ICD-10-CM | POA: Diagnosis not present

## 2014-11-15 DIAGNOSIS — R609 Edema, unspecified: Secondary | ICD-10-CM | POA: Diagnosis not present

## 2014-11-15 DIAGNOSIS — R6520 Severe sepsis without septic shock: Secondary | ICD-10-CM | POA: Diagnosis not present

## 2014-11-15 DIAGNOSIS — Z794 Long term (current) use of insulin: Secondary | ICD-10-CM

## 2014-11-15 DIAGNOSIS — D638 Anemia in other chronic diseases classified elsewhere: Secondary | ICD-10-CM | POA: Diagnosis present

## 2014-11-15 DIAGNOSIS — E0800 Diabetes mellitus due to underlying condition with hyperosmolarity without nonketotic hyperglycemic-hyperosmolar coma (NKHHC): Secondary | ICD-10-CM | POA: Diagnosis not present

## 2014-11-15 DIAGNOSIS — R0600 Dyspnea, unspecified: Secondary | ICD-10-CM

## 2014-11-15 DIAGNOSIS — R45851 Suicidal ideations: Secondary | ICD-10-CM | POA: Diagnosis not present

## 2014-11-15 DIAGNOSIS — J189 Pneumonia, unspecified organism: Secondary | ICD-10-CM | POA: Diagnosis not present

## 2014-11-15 DIAGNOSIS — Z9119 Patient's noncompliance with other medical treatment and regimen: Secondary | ICD-10-CM | POA: Diagnosis present

## 2014-11-15 DIAGNOSIS — R339 Retention of urine, unspecified: Secondary | ICD-10-CM | POA: Diagnosis present

## 2014-11-15 DIAGNOSIS — E162 Hypoglycemia, unspecified: Secondary | ICD-10-CM | POA: Diagnosis present

## 2014-11-15 DIAGNOSIS — Z993 Dependence on wheelchair: Secondary | ICD-10-CM

## 2014-11-15 DIAGNOSIS — Z9114 Patient's other noncompliance with medication regimen: Secondary | ICD-10-CM | POA: Diagnosis present

## 2014-11-15 DIAGNOSIS — A4150 Gram-negative sepsis, unspecified: Secondary | ICD-10-CM | POA: Diagnosis not present

## 2014-11-15 DIAGNOSIS — L89619 Pressure ulcer of right heel, unspecified stage: Secondary | ICD-10-CM | POA: Diagnosis present

## 2014-11-15 DIAGNOSIS — J810 Acute pulmonary edema: Secondary | ICD-10-CM | POA: Insufficient documentation

## 2014-11-15 DIAGNOSIS — L89899 Pressure ulcer of other site, unspecified stage: Secondary | ICD-10-CM | POA: Diagnosis present

## 2014-11-15 DIAGNOSIS — R7881 Bacteremia: Secondary | ICD-10-CM | POA: Diagnosis not present

## 2014-11-15 DIAGNOSIS — E440 Moderate protein-calorie malnutrition: Secondary | ICD-10-CM | POA: Diagnosis present

## 2014-11-15 DIAGNOSIS — Z89512 Acquired absence of left leg below knee: Secondary | ICD-10-CM

## 2014-11-15 DIAGNOSIS — E11621 Type 2 diabetes mellitus with foot ulcer: Secondary | ICD-10-CM

## 2014-11-15 DIAGNOSIS — Z7902 Long term (current) use of antithrombotics/antiplatelets: Secondary | ICD-10-CM | POA: Diagnosis not present

## 2014-11-15 DIAGNOSIS — E872 Acidosis, unspecified: Secondary | ICD-10-CM

## 2014-11-15 DIAGNOSIS — T383X2A Poisoning by insulin and oral hypoglycemic [antidiabetic] drugs, intentional self-harm, initial encounter: Secondary | ICD-10-CM | POA: Diagnosis present

## 2014-11-15 DIAGNOSIS — F329 Major depressive disorder, single episode, unspecified: Secondary | ICD-10-CM | POA: Diagnosis present

## 2014-11-15 DIAGNOSIS — T383X1S Poisoning by insulin and oral hypoglycemic [antidiabetic] drugs, accidental (unintentional), sequela: Secondary | ICD-10-CM | POA: Diagnosis not present

## 2014-11-15 DIAGNOSIS — E1165 Type 2 diabetes mellitus with hyperglycemia: Secondary | ICD-10-CM | POA: Diagnosis not present

## 2014-11-15 DIAGNOSIS — J9601 Acute respiratory failure with hypoxia: Secondary | ICD-10-CM | POA: Diagnosis not present

## 2014-11-15 DIAGNOSIS — J80 Acute respiratory distress syndrome: Secondary | ICD-10-CM | POA: Insufficient documentation

## 2014-11-15 DIAGNOSIS — T383X2D Poisoning by insulin and oral hypoglycemic [antidiabetic] drugs, intentional self-harm, subsequent encounter: Secondary | ICD-10-CM | POA: Diagnosis not present

## 2014-11-15 DIAGNOSIS — E876 Hypokalemia: Secondary | ICD-10-CM

## 2014-11-15 DIAGNOSIS — Z79899 Other long term (current) drug therapy: Secondary | ICD-10-CM

## 2014-11-15 DIAGNOSIS — A419 Sepsis, unspecified organism: Secondary | ICD-10-CM | POA: Diagnosis not present

## 2014-11-15 DIAGNOSIS — E11649 Type 2 diabetes mellitus with hypoglycemia without coma: Secondary | ICD-10-CM

## 2014-11-15 DIAGNOSIS — Z01818 Encounter for other preprocedural examination: Secondary | ICD-10-CM

## 2014-11-15 DIAGNOSIS — Z91018 Allergy to other foods: Secondary | ICD-10-CM

## 2014-11-15 DIAGNOSIS — D649 Anemia, unspecified: Secondary | ICD-10-CM | POA: Diagnosis present

## 2014-11-15 DIAGNOSIS — J9600 Acute respiratory failure, unspecified whether with hypoxia or hypercapnia: Secondary | ICD-10-CM

## 2014-11-15 DIAGNOSIS — T383X2S Poisoning by insulin and oral hypoglycemic [antidiabetic] drugs, intentional self-harm, sequela: Secondary | ICD-10-CM | POA: Diagnosis not present

## 2014-11-15 DIAGNOSIS — L97509 Non-pressure chronic ulcer of other part of unspecified foot with unspecified severity: Secondary | ICD-10-CM

## 2014-11-15 DIAGNOSIS — E1059 Type 1 diabetes mellitus with other circulatory complications: Secondary | ICD-10-CM | POA: Diagnosis not present

## 2014-11-15 DIAGNOSIS — Z91148 Patient's other noncompliance with medication regimen for other reason: Secondary | ICD-10-CM

## 2014-11-15 HISTORY — DX: Schizophrenia, unspecified: F20.9

## 2014-11-15 LAB — CBC WITH DIFFERENTIAL/PLATELET
BASOS ABS: 0 10*3/uL (ref 0.0–0.1)
BASOS PCT: 0 % (ref 0–1)
EOS PCT: 0 % (ref 0–5)
Eosinophils Absolute: 0 10*3/uL (ref 0.0–0.7)
HCT: 35 % — ABNORMAL LOW (ref 39.0–52.0)
HEMOGLOBIN: 11.4 g/dL — AB (ref 13.0–17.0)
Lymphocytes Relative: 13 % (ref 12–46)
Lymphs Abs: 1 10*3/uL (ref 0.7–4.0)
MCH: 28.6 pg (ref 26.0–34.0)
MCHC: 32.6 g/dL (ref 30.0–36.0)
MCV: 87.9 fL (ref 78.0–100.0)
Monocytes Absolute: 0.8 10*3/uL (ref 0.1–1.0)
Monocytes Relative: 11 % (ref 3–12)
NEUTROS PCT: 76 % (ref 43–77)
Neutro Abs: 5.7 10*3/uL (ref 1.7–7.7)
Platelets: 208 10*3/uL (ref 150–400)
RBC: 3.98 MIL/uL — ABNORMAL LOW (ref 4.22–5.81)
RDW: 13.4 % (ref 11.5–15.5)
WBC: 7.6 10*3/uL (ref 4.0–10.5)

## 2014-11-15 LAB — URINALYSIS, ROUTINE W REFLEX MICROSCOPIC
BILIRUBIN URINE: NEGATIVE
GLUCOSE, UA: 500 mg/dL — AB
Ketones, ur: NEGATIVE mg/dL
Leukocytes, UA: NEGATIVE
Nitrite: NEGATIVE
PROTEIN: 100 mg/dL — AB
Specific Gravity, Urine: 1.021 (ref 1.005–1.030)
UROBILINOGEN UA: 0.2 mg/dL (ref 0.0–1.0)
pH: 5 (ref 5.0–8.0)

## 2014-11-15 LAB — URINE MICROSCOPIC-ADD ON

## 2014-11-15 LAB — COMPREHENSIVE METABOLIC PANEL
ALBUMIN: 3.2 g/dL — AB (ref 3.5–5.0)
ALT: 19 U/L (ref 17–63)
AST: 30 U/L (ref 15–41)
Alkaline Phosphatase: 69 U/L (ref 38–126)
Anion gap: 12 (ref 5–15)
BILIRUBIN TOTAL: 0.5 mg/dL (ref 0.3–1.2)
BUN: 22 mg/dL — ABNORMAL HIGH (ref 6–20)
CHLORIDE: 105 mmol/L (ref 101–111)
CO2: 21 mmol/L — ABNORMAL LOW (ref 22–32)
CREATININE: 1.01 mg/dL (ref 0.61–1.24)
Calcium: 9 mg/dL (ref 8.9–10.3)
GFR calc Af Amer: >60 mL/min (ref >60)
GFR calc non Af Amer: >60 mL/min (ref >60)
Glucose, Bld: 51 mg/dL — ABNORMAL LOW (ref 65–99)
Potassium: 3.3 mmol/L — ABNORMAL LOW (ref 3.5–5.1)
Sodium: 138 mmol/L (ref 135–145)
TOTAL PROTEIN: 6.2 g/dL — AB (ref 6.5–8.1)

## 2014-11-15 LAB — I-STAT CG4 LACTIC ACID, ED: Lactic Acid, Venous: 2.36 mmol/L (ref 0.5–2.0)

## 2014-11-15 LAB — RAPID URINE DRUG SCREEN, HOSP PERFORMED
Amphetamines: NOT DETECTED
BARBITURATES: NOT DETECTED
BENZODIAZEPINES: NOT DETECTED
COCAINE: NOT DETECTED
Opiates: NOT DETECTED
TETRAHYDROCANNABINOL: NOT DETECTED

## 2014-11-15 LAB — CBG MONITORING, ED
GLUCOSE-CAPILLARY: 59 mg/dL — AB (ref 65–99)
GLUCOSE-CAPILLARY: 99 mg/dL (ref 65–99)
Glucose-Capillary: 48 mg/dL — ABNORMAL LOW (ref 65–99)
Glucose-Capillary: 54 mg/dL — ABNORMAL LOW (ref 65–99)
Glucose-Capillary: 75 mg/dL (ref 65–99)
Glucose-Capillary: 96 mg/dL (ref 65–99)

## 2014-11-15 LAB — ACETAMINOPHEN LEVEL: Acetaminophen (Tylenol), Serum: <10 ug/mL — ABNORMAL LOW (ref 10–30)

## 2014-11-15 LAB — ETHANOL

## 2014-11-15 LAB — SALICYLATE LEVEL

## 2014-11-15 MED ORDER — DEXTROSE 5 % IV SOLN
Freq: Once | INTRAVENOUS | Status: AC
  Administered 2014-11-15: 19:00:00 via INTRAVENOUS

## 2014-11-15 MED ORDER — SODIUM CHLORIDE 0.9 % IV BOLUS (SEPSIS)
1000.0000 mL | Freq: Once | INTRAVENOUS | Status: AC
  Administered 2014-11-15: 1000 mL via INTRAVENOUS

## 2014-11-15 MED ORDER — DEXTROSE 10 % IV SOLN
INTRAVENOUS | Status: DC
  Administered 2014-11-15 – 2014-11-16 (×2): via INTRAVENOUS

## 2014-11-15 MED ORDER — DEXTROSE 50 % IV SOLN
1.0000 | Freq: Once | INTRAVENOUS | Status: AC
  Administered 2014-11-15 – 2014-11-16 (×2): 50 mL via INTRAVENOUS
  Filled 2014-11-15: qty 50

## 2014-11-15 MED ORDER — HEPARIN SODIUM (PORCINE) 5000 UNIT/ML IJ SOLN
5000.0000 [IU] | Freq: Three times a day (TID) | INTRAMUSCULAR | Status: DC
  Administered 2014-11-15 – 2014-11-29 (×19): 5000 [IU] via SUBCUTANEOUS
  Filled 2014-11-15 (×44): qty 1

## 2014-11-15 MED ORDER — GLUCAGON HCL RDNA (DIAGNOSTIC) 1 MG IJ SOLR
1.0000 mg | Freq: Once | INTRAMUSCULAR | Status: AC
Start: 2014-11-15 — End: 2014-11-15
  Administered 2014-11-15: 1 mg via INTRAVENOUS
  Filled 2014-11-15: qty 1

## 2014-11-15 MED ORDER — POTASSIUM CHLORIDE 10 MEQ/100ML IV SOLN
10.0000 meq | INTRAVENOUS | Status: AC
  Administered 2014-11-15 – 2014-11-16 (×5): 10 meq via INTRAVENOUS
  Filled 2014-11-15 (×4): qty 100

## 2014-11-15 NOTE — ED Notes (Signed)
BS 48 MD notified, increase D5 to 200 ml per hour

## 2014-11-15 NOTE — ED Notes (Signed)
Pt cbg 59.
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
## 2014-11-15 NOTE — ED Notes (Addendum)
Pt states he took 240 units of novolog

## 2014-11-15 NOTE — ED Notes (Signed)
Dale Hunter NT sitting at bedside.

## 2014-11-15 NOTE — H&P (Signed)
[facility] History and Physical  Dale BattlesScott Legrande ZOX:[id number] DOB: 09/30/1965 DOA: 11/15/2014  Referring physician: EDP PCP: Default, Provider, MD   Chief Complaint: OD on insulin   HPI: Dale Hunter is a 49 y.o. male h/o DM2, states that he overdosed on insulin today intentionally.  He admits to taking "a lot" of "novolog" insulin.  Per family the OD occurred between 12:30 and 1PM.  Patient also apparently made suicidal / homicidal comments to family.  And very psychotic comments to the EDP (see Dr. Rondel BatonMiller's note).  It is unclear if he overdosed on any other meds, however he is STILL dropping his BGLs despite 1.5 gms of D50 initially and 200 cc/hr D5 half now 9 hours later or so.  Review of Systems: Systems reviewed.  As above, otherwise negative  Past Medical History  Diagnosis Date  . Diabetes mellitus without complication   . Osteomyelitis    Past Surgical History  Procedure Laterality Date  . Below knee leg amputation     Social History:  reports that he has never smoked. He does not have any smokeless tobacco history on file. He reports that he does not drink alcohol or use illicit drugs.  Allergies  Allergen Reactions  . Garlic Other (See Comments)    STOMACH ISSUES  . Advil [Ibuprofen] Rash    History reviewed. No pertinent family history.   Prior to Admission medications   Medication Sig Start Date End Date Taking? Authorizing Provider  clopidogrel (PLAVIX) 75 MG tablet Take 75 mg by mouth daily.   Yes Historical Provider, MD  insulin aspart (NOVOLOG) 100 UNIT/ML injection Inject 12-22 Units into the skin 3 (three) times daily before meals.   Yes Historical Provider, MD  insulin detemir (LEVEMIR) 100 UNIT/ML injection Inject 15-18 Units into the skin at bedtime.    Yes Historical Provider, MD  Asenapine Maleate 10 MG SUBL Place 10 mg under the tongue daily.    Historical Provider, MD  Cholecalciferol (VITAMIN D3) 2000 UNITS TABS Take 4,000 Units by mouth daily.     Historical Provider, MD  donepezil (ARICEPT) 10 MG tablet Take 10 mg by mouth at bedtime.    Historical Provider, MD  memantine (NAMENDA) 10 MG tablet Take 10 mg by mouth 2 (two) times daily.    Historical Provider, MD   Physical Exam: Filed Vitals:   11/15/14 2015  BP: 148/68  Pulse: 111  Temp:   Resp: 20    BP 148/68 mmHg  Pulse 111  Temp(Src) 98 F (36.7 C) (Oral)  Resp 20  SpO2 100%  General Appearance:    Alert, actively delusional / psychotic, no distress, appears stated age  Head:    Normocephalic, atraumatic  Eyes:    PERRL, EOMI, sclera non-icteric        Nose:   Nares without drainage or epistaxis. Mucosa, turbinates normal  Throat:   Moist mucous membranes. Oropharynx without erythema or exudate.  Neck:   Supple. No carotid bruits.  No thyromegaly.  No lymphadenopathy.   Back:     No CVA tenderness, no spinal tenderness  Lungs:     Clear to auscultation bilaterally, without wheezes, rhonchi or rales  Chest wall:    No tenderness to palpitation  Heart:    Regular rate and rhythm without murmurs, gallops, rubs  Abdomen:     Soft, non-tender, nondistended, normal bowel sounds, no organomegaly  Genitalia:    deferred  Rectal:    deferred  Extremities:   No clubbing, cyanosis  or edema.  Pulses:   2+ and symmetric all extremities  Skin:   R foot ulcers / erythema / swelling, L leg is S/P amputation  Lymph nodes:   Cervical, supraclavicular, and axillary nodes normal  Neurologic:   CNII-XII intact. Normal strength, sensation and reflexes      throughout    Labs on Admission:  Basic Metabolic Panel:  Recent Labs Lab 11/15/14 1953  NA 138  K 3.3*  CL 105  CO2 21*  GLUCOSE 51*  BUN 22*  CREATININE 1.01  CALCIUM 9.0   Liver Function Tests:  Recent Labs Lab 11/15/14 1953  AST 30  ALT 19  ALKPHOS 69  BILITOT 0.5  PROT 6.2*  ALBUMIN 3.2*   No results for input(s): LIPASE, AMYLASE in the last 168 hours. No results for input(s): AMMONIA in the last  168 hours. CBC:  Recent Labs Lab 11/15/14 1953  WBC 7.6  NEUTROABS 5.7  HGB 11.4*  HCT 35.0*  MCV 87.9  PLT 208   Cardiac Enzymes: No results for input(s): CKTOTAL, CKMB, CKMBINDEX, TROPONINI in the last 168 hours.  BNP (last 3 results) No results for input(s): PROBNP in the last 8760 hours. CBG:  Recent Labs Lab 11/15/14 1811 11/15/14 1844 11/15/14 1949 11/15/14 2104 11/15/14 2154  GLUCAP 59* 96 48* 54* 99    Radiological Exams on Admission: No results found.  EKG: Independently reviewed.  Assessment/Plan Principal Problem:   Insulin overdose Active Problems:   Diabetes mellitus   Hypoglycemia   1. Insulin overdose / suicide attempt - suspect that the reported 100-300 units of insulin the patient took was actually levemir (or included some overdose of levemir) given that he is still having hypoglycemia despite high dose dextrose fluids now 9+ hours after the overdose. 1. Hold all home meds 2. Converting from D5 to D10 3. PCCM consulted re: glucagon gtt appropriateness (I cannot order this on floor or SDU level of care) 4. Q1H BGL checks 5. Q4H BMPs 1. Also monitor sodium and adjust sodium content of fluid as needed 6. TTS consulted 2. Hypokalemia 1. Mild at 3.3 but expect this to worsen with ongoing glucose treatment 2. 4 runs of KCL iv ordered 3. BMP Q4H    Code Status: Full Code  Family Communication: No family in room Disposition Plan: Admit to inpatient, to psych when stable   Time spent: 70 min  GARDNER, JARED M. [facility] Pager [phone]  If 7AM-7PM, please contact the day team taking care of the patient [email or web address] Password [email or web address] 11/15/2014, 10:03 PM

## 2014-11-15 NOTE — ED Notes (Signed)
Given two cups of water and stated "I'll try again after this." RN at bedside.

## 2014-11-15 NOTE — ED Notes (Signed)
Pt CBG 54, RN at bedside, MD aware.

## 2014-11-15 NOTE — ED Notes (Signed)
Sitter to bedside
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
## 2014-11-15 NOTE — ED Notes (Signed)
Pt in via EMS from home, pt took an unknown amount of insulin around 1230 or 1pm today, between 100-300 units, pt admits to SI/HI toward family, pt has received D50 1.5 amps, CBG continues to drop at this time, 59 on arrival, pt pale and diaphoretic, IV in right FA
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
## 2014-11-15 NOTE — ED Notes (Signed)
Called flow to send sitter.

## 2014-11-15 NOTE — ED Notes (Signed)
Poison control recommends feeding patient, check glucose at least every hour until patient is maintaining a level, will probably need close monitoring of glucose until morning.
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
## 2014-11-15 NOTE — ED Notes (Signed)
Pt given urinal to attempt to give urine sample

## 2014-11-15 NOTE — BH Assessment (Signed)
Spoke with Dr. Hyacinth MeekerMiller who reports pt will be admitted medically. Per Donell SievertSpencer Simon, PA pt to have inpt psychiatric evaluation once medically cleared. Dr. Hyacinth MeekerMiller is in agreement with this plan.    Clista BernhardtNancy Deandrea Vanpelt, [facility] Triage Specialist 11/15/2014 9:08 PM

## 2014-11-15 NOTE — ED Notes (Signed)
Patient transported to X-ray 

## 2014-11-15 NOTE — ED Notes (Signed)
Admitting at bedside 
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
## 2014-11-15 NOTE — ED Notes (Signed)
TTS in progress 

## 2014-11-15 NOTE — ED Notes (Signed)
cbg 96 

## 2014-11-15 NOTE — ED Notes (Signed)
PT CBG 99 °

## 2014-11-16 ENCOUNTER — Encounter (HOSPITAL_COMMUNITY): Payer: Self-pay | Admitting: General Practice

## 2014-11-16 DIAGNOSIS — R609 Edema, unspecified: Secondary | ICD-10-CM | POA: Insufficient documentation

## 2014-11-16 DIAGNOSIS — E0800 Diabetes mellitus due to underlying condition with hyperosmolarity without nonketotic hyperglycemic-hyperosmolar coma (NKHHC): Secondary | ICD-10-CM

## 2014-11-16 DIAGNOSIS — F200 Paranoid schizophrenia: Secondary | ICD-10-CM

## 2014-11-16 DIAGNOSIS — Z9114 Patient's other noncompliance with medication regimen: Secondary | ICD-10-CM

## 2014-11-16 DIAGNOSIS — T383X2A Poisoning by insulin and oral hypoglycemic [antidiabetic] drugs, intentional self-harm, initial encounter: Principal | ICD-10-CM

## 2014-11-16 DIAGNOSIS — N179 Acute kidney failure, unspecified: Secondary | ICD-10-CM

## 2014-11-16 DIAGNOSIS — E11649 Type 2 diabetes mellitus with hypoglycemia without coma: Secondary | ICD-10-CM | POA: Insufficient documentation

## 2014-11-16 DIAGNOSIS — R45851 Suicidal ideations: Secondary | ICD-10-CM

## 2014-11-16 LAB — GLUCOSE, CAPILLARY
GLUCOSE-CAPILLARY: 117 mg/dL — AB (ref 65–99)
GLUCOSE-CAPILLARY: 154 mg/dL — AB (ref 65–99)
GLUCOSE-CAPILLARY: 277 mg/dL — AB (ref 65–99)
GLUCOSE-CAPILLARY: 320 mg/dL — AB (ref 65–99)
GLUCOSE-CAPILLARY: 49 mg/dL — AB (ref 65–99)
Glucose-Capillary: 137 mg/dL — ABNORMAL HIGH (ref 65–99)
Glucose-Capillary: 153 mg/dL — ABNORMAL HIGH (ref 65–99)
Glucose-Capillary: 180 mg/dL — ABNORMAL HIGH (ref 65–99)
Glucose-Capillary: 228 mg/dL — ABNORMAL HIGH (ref 65–99)
Glucose-Capillary: 260 mg/dL — ABNORMAL HIGH (ref 65–99)
Glucose-Capillary: 205 mg/dL — ABNORMAL HIGH (ref 65–99)
Glucose-Capillary: 183 mg/dL — ABNORMAL HIGH (ref 65–99)
Glucose-Capillary: 181 mg/dL — ABNORMAL HIGH (ref 65–99)

## 2014-11-16 LAB — BASIC METABOLIC PANEL
ANION GAP: 9 (ref 5–15)
ANION GAP: 9 (ref 5–15)
Anion gap: 9 (ref 5–15)
Anion gap: 10 (ref 5–15)
Anion gap: 10 (ref 5–15)
BUN: 16 mg/dL (ref 6–20)
BUN: 16 mg/dL (ref 6–20)
BUN: 17 mg/dL (ref 6–20)
BUN: 19 mg/dL (ref 6–20)
BUN: 20 mg/dL (ref 6–20)
CALCIUM: 8.5 mg/dL — AB (ref 8.9–10.3)
CALCIUM: 8.4 mg/dL — AB (ref 8.9–10.3)
CALCIUM: 8.4 mg/dL — AB (ref 8.9–10.3)
CHLORIDE: 105 mmol/L (ref 101–111)
CHLORIDE: 104 mmol/L (ref 101–111)
CO2: 21 mmol/L — AB (ref 22–32)
CO2: 21 mmol/L — AB (ref 22–32)
CO2: 21 mmol/L — ABNORMAL LOW (ref 22–32)
CO2: 22 mmol/L (ref 22–32)
CO2: 24 mmol/L (ref 22–32)
CREATININE: 0.97 mg/dL (ref 0.61–1.24)
CREATININE: 1.09 mg/dL (ref 0.61–1.24)
CREATININE: 1.43 mg/dL — AB (ref 0.61–1.24)
Calcium: 8.4 mg/dL — ABNORMAL LOW (ref 8.9–10.3)
Calcium: 8.4 mg/dL — ABNORMAL LOW (ref 8.9–10.3)
Chloride: 103 mmol/L (ref 101–111)
Chloride: 104 mmol/L (ref 101–111)
Chloride: 106 mmol/L (ref 101–111)
Creatinine, Ser: 1.09 mg/dL (ref 0.61–1.24)
Creatinine, Ser: 1.25 mg/dL — ABNORMAL HIGH (ref 0.61–1.24)
GFR calc Af Amer: >60 mL/min (ref >60)
GFR calc non Af Amer: 57 mL/min — ABNORMAL LOW (ref >60)
GFR calc non Af Amer: >60 mL/min (ref >60)
GLUCOSE: 305 mg/dL — AB (ref 65–99)
Glucose, Bld: 146 mg/dL — ABNORMAL HIGH (ref 65–99)
Glucose, Bld: 258 mg/dL — ABNORMAL HIGH (ref 65–99)
Glucose, Bld: 327 mg/dL — ABNORMAL HIGH (ref 65–99)
Glucose, Bld: 50 mg/dL — ABNORMAL LOW (ref 65–99)
POTASSIUM: 4.2 mmol/L (ref 3.5–5.1)
Potassium: 3.7 mmol/L (ref 3.5–5.1)
Potassium: 4.3 mmol/L (ref 3.5–5.1)
Potassium: 4.4 mmol/L (ref 3.5–5.1)
Potassium: 4.5 mmol/L (ref 3.5–5.1)
SODIUM: 136 mmol/L (ref 135–145)
Sodium: 136 mmol/L (ref 135–145)
Sodium: 135 mmol/L (ref 135–145)
Sodium: 135 mmol/L (ref 135–145)
Sodium: 136 mmol/L (ref 135–145)

## 2014-11-16 LAB — MRSA PCR SCREENING: MRSA by PCR: NEGATIVE

## 2014-11-16 LAB — CG4 I-STAT (LACTIC ACID): Lactic Acid, Venous: 1.23 mmol/L (ref 0.5–2.0)

## 2014-11-16 MED ORDER — INSULIN DETEMIR 100 UNIT/ML ~~LOC~~ SOLN
12.0000 [IU] | Freq: Every day | SUBCUTANEOUS | Status: DC
  Filled 2014-11-16 (×4): qty 0.12

## 2014-11-16 MED ORDER — DEXTROSE 50 % IV SOLN
INTRAVENOUS | Status: AC
  Administered 2014-11-16: 50 mL via INTRAVENOUS
  Filled 2014-11-16: qty 50

## 2014-11-16 MED ORDER — RISPERIDONE 1 MG PO TBDP
1.0000 mg | ORAL_TABLET | Freq: Every day | ORAL | Status: DC
  Filled 2014-11-16 (×3): qty 1

## 2014-11-16 MED ORDER — DEXTROSE 5 % IV SOLN
Freq: Once | INTRAVENOUS | Status: AC
  Administered 2014-11-16: 150 mL via INTRAVENOUS

## 2014-11-16 MED ORDER — INSULIN ASPART 100 UNIT/ML ~~LOC~~ SOLN
0.0000 [IU] | SUBCUTANEOUS | Status: DC
  Administered 2014-11-16: 5 [IU] via SUBCUTANEOUS
  Administered 2014-11-16: 3 [IU] via SUBCUTANEOUS
  Administered 2014-11-16 – 2014-11-17 (×2): 7 [IU] via SUBCUTANEOUS
  Administered 2014-11-17 – 2014-11-18 (×2): 9 [IU] via SUBCUTANEOUS
  Administered 2014-11-18: 2 [IU] via SUBCUTANEOUS
  Administered 2014-11-18: 9 [IU] via SUBCUTANEOUS
  Administered 2014-11-18: 5 [IU] via SUBCUTANEOUS
  Administered 2014-11-19 (×2): 1 [IU] via SUBCUTANEOUS
  Administered 2014-11-19: 2 [IU] via SUBCUTANEOUS
  Administered 2014-11-19: 3 [IU] via SUBCUTANEOUS
  Administered 2014-11-20: 5 [IU] via SUBCUTANEOUS
  Administered 2014-11-20 – 2014-11-21 (×3): 3 [IU] via SUBCUTANEOUS
  Administered 2014-11-21: 5 [IU] via SUBCUTANEOUS
  Administered 2014-11-21: 2 [IU] via SUBCUTANEOUS
  Administered 2014-11-21: 1 [IU] via SUBCUTANEOUS
  Administered 2014-11-21: 5 [IU] via SUBCUTANEOUS
  Administered 2014-11-22: 3 [IU] via SUBCUTANEOUS
  Administered 2014-11-22 (×2): 2 [IU] via SUBCUTANEOUS
  Administered 2014-11-23: 3 [IU] via SUBCUTANEOUS
  Administered 2014-11-23: 2 [IU] via SUBCUTANEOUS
  Administered 2014-11-24: 3 [IU] via SUBCUTANEOUS
  Administered 2014-11-24 – 2014-11-25 (×2): 2 [IU] via SUBCUTANEOUS
  Administered 2014-11-25: 7 [IU] via SUBCUTANEOUS
  Administered 2014-11-25: 1 [IU] via SUBCUTANEOUS
  Administered 2014-11-25: 3 [IU] via SUBCUTANEOUS
  Administered 2014-11-25: 1 [IU] via SUBCUTANEOUS
  Administered 2014-11-26: 2 [IU] via SUBCUTANEOUS
  Administered 2014-11-26 (×2): 1 [IU] via SUBCUTANEOUS
  Administered 2014-11-27 (×4): 2 [IU] via SUBCUTANEOUS
  Administered 2014-11-27: 3 [IU] via SUBCUTANEOUS
  Administered 2014-11-27: 5 [IU] via SUBCUTANEOUS
  Administered 2014-11-28: 3 [IU] via SUBCUTANEOUS
  Administered 2014-11-28: 2 [IU] via SUBCUTANEOUS
  Administered 2014-11-28: 1 [IU] via SUBCUTANEOUS
  Administered 2014-11-28: 2 [IU] via SUBCUTANEOUS
  Administered 2014-11-28: 1 [IU] via SUBCUTANEOUS
  Administered 2014-11-29: 2 [IU] via SUBCUTANEOUS
  Administered 2014-11-29: 3 [IU] via SUBCUTANEOUS
  Administered 2014-11-29: 2 [IU] via SUBCUTANEOUS
  Administered 2014-11-29: 1 [IU] via SUBCUTANEOUS
  Administered 2014-11-29: 2 [IU] via SUBCUTANEOUS
  Administered 2014-11-30: 3 [IU] via SUBCUTANEOUS
  Administered 2014-11-30: 5 [IU] via SUBCUTANEOUS
  Administered 2014-11-30: 2 [IU] via SUBCUTANEOUS
  Administered 2014-11-30: 5 [IU] via SUBCUTANEOUS
  Administered 2014-11-30: 3 [IU] via SUBCUTANEOUS
  Administered 2014-11-30: 2 [IU] via SUBCUTANEOUS
  Administered 2014-12-01 (×2): 3 [IU] via SUBCUTANEOUS
  Administered 2014-12-01: 2 [IU] via SUBCUTANEOUS
  Administered 2014-12-01: 3 [IU] via SUBCUTANEOUS
  Administered 2014-12-01 – 2014-12-02 (×3): 1 [IU] via SUBCUTANEOUS
  Administered 2014-12-02: 2 [IU] via SUBCUTANEOUS
  Administered 2014-12-02: 5 [IU] via SUBCUTANEOUS
  Administered 2014-12-02: 3 [IU] via SUBCUTANEOUS
  Administered 2014-12-02: 5 [IU] via SUBCUTANEOUS
  Administered 2014-12-03: 1 [IU] via SUBCUTANEOUS
  Administered 2014-12-03: 7 [IU] via SUBCUTANEOUS
  Administered 2014-12-03: 3 [IU] via SUBCUTANEOUS
  Administered 2014-12-03 – 2014-12-04 (×6): 2 [IU] via SUBCUTANEOUS
  Administered 2014-12-05: 3 [IU] via SUBCUTANEOUS
  Administered 2014-12-05 (×2): 2 [IU] via SUBCUTANEOUS
  Administered 2014-12-05: 5 [IU] via SUBCUTANEOUS
  Administered 2014-12-06: 2 [IU] via SUBCUTANEOUS
  Administered 2014-12-06: 1 [IU] via SUBCUTANEOUS
  Administered 2014-12-06: 2 [IU] via SUBCUTANEOUS
  Administered 2014-12-06: 3 [IU] via SUBCUTANEOUS
  Administered 2014-12-06: 5 [IU] via SUBCUTANEOUS
  Administered 2014-12-07 (×2): 2 [IU] via SUBCUTANEOUS
  Administered 2014-12-07 (×2): 3 [IU] via SUBCUTANEOUS
  Administered 2014-12-07: 5 [IU] via SUBCUTANEOUS

## 2014-11-16 MED ORDER — PNEUMOCOCCAL VAC POLYVALENT 25 MCG/0.5ML IJ INJ
0.5000 mL | INJECTION | INTRAMUSCULAR | Status: DC
  Filled 2014-11-16: qty 0.5

## 2014-11-16 MED ORDER — DEXTROSE 50 % IV SOLN
50.0000 mL | INTRAVENOUS | Status: DC | PRN
Start: 2014-11-16 — End: 2014-12-01
  Administered 2014-11-19 – 2014-11-26 (×3): 50 mL via INTRAVENOUS

## 2014-11-16 MED ORDER — INSULIN DETEMIR 100 UNIT/ML ~~LOC~~ SOLN
6.0000 [IU] | Freq: Once | SUBCUTANEOUS | Status: DC
  Filled 2014-11-16: qty 0.06

## 2014-11-16 NOTE — Progress Notes (Addendum)
Inpatient Diabetes Program Recommendations  AACE/ADA: New Consensus Statement on Inpatient Glycemic Control  Target Ranges:  Prepandial:   less than 140 mg/dL      Peak postprandial:   less than 180 mg/dL (1-2 hours)      Critically ill patients:  140 - 180 mg/dL  Pager:  [phone]    Reason for Visit: Intentional insulin overdose, but now glucose increasing. Patient is Type 1 DM   Inpatient Diabetes Program Recommendations Insulin - Basal: Begin with low dose Levemir and titrate to home dose of 15 units qhs since patient requires basal insulin and now seems to be coming out of hypoglycemic state   Dale Clientrissy Paylin Hailu PhD, RN, BC-ADM Diabetes Coordinator  Office:  [phone] Team Pager:  [phone]

## 2014-11-16 NOTE — Progress Notes (Signed)
Patient still refusing to eat his dinner, have oral temperature taken also refusing to take risperidone PO. Pt educated on indication and states "I can't take anything by mouth, i'm allergic to it." Craige CottaKirby, NP paged about patient's lack of intake. NP gave telephone readback order for patient to receive Levemir 6 units tonight, NP stated for RN to not give patient Levemir 12 units tonight. Will continue to monitor patient.
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
## 2014-11-16 NOTE — Progress Notes (Signed)
Patient refused for RN to perform physical assessment on him - guarded. Refusing insulin at this time also. Will reassess patient and continue to monitor.

## 2014-11-16 NOTE — Progress Notes (Signed)
Patient refused subq heparin. I explained the importance to prevent clots and stroke .Patient continued to refuse. States he is not comfortable with it

## 2014-11-16 NOTE — Consult Note (Signed)
WOC wound consult note Reason for Consult:Wound care to three areas on right foot (two partial thickness open areas at the ankle, DTPI at the plantar surface of the heel and intertriginous dermatitis between the great and 1st digits). Wound type: Pressure, trauma and moisture Pressure Ulcer POA: Yes Measurement: right heel:  3cm x 3cm area of discoloration (purple/maroon) with no broken skin due to external pressure created by improperly fitting shoe wear.  Intertriginous dermatitis between two toes measuring 3cm x 0.4cm x 0.2cm with moist, macerated wound bed.  Two partial thickness open wounds at ankle, the proximal measures 1cm x 1.2cm x 0.2cm and the distal measures 2.5cm x 2.0cm x 0.2cm with a clean, pink, wound bed.  Wound bed:AS described above Drainage (amount, consistency, odor) Scant serous.  Dried blood noted on the posterior ankle, but no wound is noted in this area. Periwound:intact, dry. Dressing procedure/placement/frequency: I will provided nursing orders for wound care to the right foot that include a soft silicone foam dressing to the open areas at the ankle, painting the DTPI on the plantar aspect of the foot with a betadine swabstick daily to provide antimicrobial care and dry the area via the astringent properties of betadine, use of an antimicrobial textile to wick moisture from the intertriginous space and a provision of a pressure redistribution boot to protect the foot from further injury. WOC nursing team will not follow, but will remain available to this patient, the nursing and medical team.  Please re-consult if needed. Thanks, Ladona MowLaurie Varick Keys, MSN, RN, GNP, [place], CWON-AP [phone])
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
## 2014-11-17 DIAGNOSIS — E162 Hypoglycemia, unspecified: Secondary | ICD-10-CM

## 2014-11-17 DIAGNOSIS — L8990 Pressure ulcer of unspecified site, unspecified stage: Secondary | ICD-10-CM | POA: Insufficient documentation

## 2014-11-17 LAB — GLUCOSE, CAPILLARY
GLUCOSE-CAPILLARY: 324 mg/dL — AB (ref 65–99)
Glucose-Capillary: 474 mg/dL — ABNORMAL HIGH (ref 65–99)
Glucose-Capillary: 333 mg/dL — ABNORMAL HIGH (ref 65–99)
Glucose-Capillary: 121 mg/dL — ABNORMAL HIGH (ref 65–99)

## 2014-11-17 LAB — URINE CULTURE: Colony Count: 5000

## 2014-11-17 MED ORDER — INSULIN ASPART 100 UNIT/ML ~~LOC~~ SOLN
5.0000 [IU] | Freq: Once | SUBCUTANEOUS | Status: AC
  Administered 2014-11-17: 5 [IU] via SUBCUTANEOUS

## 2014-11-17 NOTE — Progress Notes (Signed)
Patient refused for 0000 CBG to be taken. Patient information still scanned into glucometer when CBG (93) taken for another patient.

## 2014-11-17 NOTE — Progress Notes (Signed)
Patient refused to have 0000 CBG taken. Patient has had poor intake, patient's dinner tray still at bedside - all items untouched. Does not want RN to take tray at this time. RN informed patient to let staff know if he would like for tray to be warmed. Graham crackers left for patient at bedside, patient offered drink - patient declined and stated that he would only like water to drink. Will continue to monitor patient.

## 2014-11-17 NOTE — Care Management Note (Signed)
Case Management Note  Patient Details  Name: Dale Hunter MRN: [id number] Date of Birth: 07/22/1965  Subjective/Objective:      Patient blood sugars in 500's today, not medically ready for inpt psych yet.  Psych CSW aware of patient.               Action/Plan:   Expected Discharge Date:                  [facility] Dis[facility] Plan:  [facility]  In-House Referral:  Clinical Social Work  Discharge planning Services  CM Consult  Post Acute Care Choice:    Choice offered to:     DME Arranged:    DME Agency:     HH Arranged:    HH Agency:     Status of Service:  In process, will continue to follow  Medicare Important Message Given:  No Date Medicare IM Given:    Medicare IM give by:    Date Additional Medicare IM Given:    Additional Medicare Important Message give by:     If discussed at Long Length of Stay Meetings, dates discussed:    Additional Comments:  Leone Havenaylor, Brynna Dobos [place], RN 11/17/2014, 3:34 PM

## 2014-11-17 NOTE — Progress Notes (Signed)
Pt made aware of high CBG 324. Pt alert and oriented, pt knows his name, where he is, and what month it is. Refuse to take Insulin.

## 2014-11-17 NOTE — Progress Notes (Signed)
PROGRESS NOTE  Dale Hunter EAV:[id number] DOB: 1965-10-08 DOA: 11/15/2014 PCP: Default, Provider, MD  HPI: Dale Hunter is a 49 year old male brought via EMS to ED on 11/15/2014 with an episode of severe diabetic hypoglycemia due to an insulin overdose as a suicide attempt.  Subjective / 24 H Interval events - Dale Hunter is very guarded today, he states he can't tell us anything because we will think he is nuts. When asked what we can do for him he stares at his hands and doesn't speak. - Began refusing care at 11/16/2014 at 1459, when asked why he started refusing all medications and food last night he had no response. - 11/17/2014 1306 allowed nursing staff to check CBG, recorded at 474 and allowed administration of insulin 15 units  Assessment/Plan: Principal Problem:   Non compliance w medication regimen Active Problems:   Diabetes mellitus   Insulin overdose   Hypoglycemia   Severe diabetic hypoglycemia   Swelling   Schizophrenia, paranoid   Pressure ulcer   Non-compliance with medication regimen - refusing any labs to be drawn, physical assessments by nursing staff,  medications, or food since 11/16/2014 - attempted reasoning with patient, 11/17/2014 1306 allowed nursing staff to check CBG, recorded at 474 and allowed administration of insulin 15 units - appreciate psychiatry help with this very difficult patient  Insulin Overdose in Setting of Suicidal Ideation - Patient admits to overdose as a suicide attempt, psychiatry consulted. - high risk to develop DKA if he continues to refuse CBGs / insulin administration   IDDM, with renal, neuropathic complications - s/p left BKA - sliding scale and Levemir 12 units qd at bedtime as he is now hyperglycemic - he refused his long acting last night - refusing labs  Schizophrenia, paranoid - risperidone ordered, patient refused - psych recommends inpatient psychiatric admission when medically stable - difficult to determine when he is  stable as he is not compliant with CBGs / insulin  RLE wounds - edema has decreased  - with history of cellulitis RLE in 2015, no active infection noted - patient unable to contribute more as to how he got the wounds  Mild AKI - suspect a degree of diabetic nephropathy   Diet: Diet regular Room service appropriate?: Yes; Fluid consistency:: Thin Fluids: none  DVT Prophylaxis: Heparin (refused)  Code Status: Full Code Family Communication: None Disposition Plan:   Consultants:  psychiatry  Procedures:  none   Antibiotics  Anti-infectives    None       Studies  Dg Tibia/fibula Right  11/15/2014   CLINICAL DATA:  Right lower leg pain and swelling.  EXAM: RIGHT TIBIA AND FIBULA - 2 VIEW  COMPARISON:  Prior radiographs and CTs.  FINDINGS: Extensive deformity and degenerative changes of the hindfoot again noted.  There is no evidence of acute fracture.  No new focal bony abnormalities are noted.  Soft tissue swelling is present.  No radiographic evidence of acute osteomyelitis identified.  IMPRESSION: Unchanged appearance of the right ankle with extensive deformity and degenerative changes of the hindfoot.  Soft tissue swelling without definite acute bony abnormality.   Electronically Signed   By: Harmon Pier M.D.   On: 11/15/2014 23:00   Dg Foot Complete Right  11/15/2014   CLINICAL DATA:  Chronic right foot pain. Current history of diabetes. Initial encounter.  EXAM: RIGHT FOOT COMPLETE - 3+ VIEW  COMPARISON:  Right foot radiographs performed 08/19/2014  FINDINGS: No definite acute osseous erosions are seen to  suggest osteomyelitis.  Charcot joint is noted at the hindfoot, somewhat worsened from the prior study, with loss of visualization of the tibiotalar and subtalar joint. Irregularity along the medial aspect of the base of the first proximal phalanx is stable and likely reflects degenerative change. The midfoot appears grossly intact. An os peroneum is noted.  There is mild  diffuse osteopenia of visualized osseous structures. Mild soft tissue swelling is noted about the hindfoot.  IMPRESSION: 1. No acute osseous erosions seen. 2. Charcot joint at the hindfoot, somewhat worsened from the prior study. 3. Mild diffuse osteopenia of visualized osseous structures. 4. Os peroneum noted.   Electronically Signed   By: Roanna Raider M.D.   On: 11/15/2014 22:59    Objective  Filed Vitals:   11/16/14 1755 11/16/14 2220 11/17/14 0525 11/17/14 0545  BP: 117/57 155/83 173/144 126/85  Pulse: 106 99 115 101  Temp: 97.5 F (36.4 C) 98.3 F (36.8 C)    TempSrc: Oral Axillary    Resp: Height:  (1.778 m)     Weight: 71.623 kg (157 lb 14.4 oz)     SpO2: 99% 98%  100%    Intake/Output Summary (Last 24 hours) at 11/17/14 1500 Last data filed at 11/17/14 1355  Gross per 24 hour  Intake      0 ml  Output   2150 ml  Net  -2150 ml   Filed Weights   11/16/14 0030 11/16/14 1755  Weight: 71.3 kg (157 lb 3 oz) 71.623 kg (157 lb 14.4 oz)    Exam:  General:  Dale Hunter was resting in bed, but visibly uncomfortable with provider presence in room  HEENT: no scleral icterus, PERRL  Cardiovascular: RRR without MRG, 2+ peripheral pulses, edema RLE improved  Respiratory: CTA biL, good air movement, no wheezing, no crackles, no rales  Abdomen: soft, non tender, BS +, no guarding  MSK/Extremities: no clubbing/cyanosis, no joint swelling  Skin: no rashes  Neuro: moves all 3  Data Reviewed: Basic Metabolic Panel:  Recent Labs Lab 11/15/14 2350 11/16/14 0336 11/16/14 0902 11/16/14 1146 11/16/14 1532  NA 136 136 135 135 136  K 3.7 4.2 4.5 4.4 4.3  CL 106 103 105 104 104  CO2 21* 24 21* 21* 22  GLUCOSE 50* 146* 327* 305* 258*  BUN CREATININE 0.97 1.09 1.09 1.25* 1.43*  CALCIUM 8.4* 8.4* 8.5* 8.4* 8.4*   Liver Function Tests:  Recent Labs Lab 11/15/14 1953  AST 30  ALT 19  ALKPHOS 69  BILITOT 0.5  PROT 6.2*  ALBUMIN 3.2*     CBC:  Recent Labs Lab 11/15/14 1953  WBC 7.6  NEUTROABS 5.7  HGB 11.4*  HCT 35.0*  MCV 87.9  PLT 208   CBG:  Recent Labs Lab 11/16/14 2005 11/16/14 2159 11/17/14 0006 11/17/14 0522 11/17/14 1306  GLUCAP 183* 181* 93 324* 474*    Recent Results (from the past 240 hour(s))  Blood culture (routine x 2)     Status: None (Preliminary result)   Collection Time: 11/15/14  7:53 PM  Result Value Ref Range Status   Specimen Description BLOOD LEFT FOREARM  Final   Special Requests BOTTLES DRAWN AEROBIC AND ANAEROBIC 5CC EA  Final   Culture   Final           BLOOD CULTURE RECEIVED NO GROWTH TO DATE CULTURE WILL BE HELD FOR 5 DAYS BEFORE ISSUING A FINAL NEGATIVE REPORT Performed  at [facility]    Report Status PENDING  Incomplete  Blood culture (routine x 2)     Status: None (Preliminary result)   Collection Time: 11/15/14  8:05 PM  Result Value Ref Range Status   Specimen Description BLOOD LEFT HAND  Final   Special Requests BOTTLES DRAWN AEROBIC AND ANAEROBIC 10CC EA  Final   Culture   Final           BLOOD CULTURE RECEIVED NO GROWTH TO DATE CULTURE WILL BE HELD FOR 5 DAYS BEFORE ISSUING A FINAL NEGATIVE REPORT Performed at [facility]    Report Status PENDING  Incomplete  Urine culture     Status: None   Collection Time: 11/15/14  9:47 PM  Result Value Ref Range Status   Specimen Description URINE, CLEAN CATCH  Final   Special Requests NONE  Final   Colony Count   Final    5,000 COLONIES/ML Performed at [facility]    Culture   Final    INSIGNIFICANT GROWTH Performed at [facility]    Report Status 11/17/2014 FINAL  Final  MRSA PCR Screening     Status: None   Collection Time: 11/16/14 12:26 AM  Result Value Ref Range Status   MRSA by PCR NEGATIVE NEGATIVE Final    Comment:        The GeneXpert MRSA Assay (FDA approved for NASAL specimens only), is one component of a comprehensive MRSA colonization surveillance program.  It is not intended to diagnose MRSA infection nor to guide or monitor treatment for MRSA infections.      Scheduled Meds: . heparin  5,000 Units Subcutaneous 3 times per day  . insulin aspart  0-9 Units Subcutaneous 6 times per day  . insulin detemir  12 Units Subcutaneous QHS  . insulin detemir  6 Units Subcutaneous Once  . pneumococcal 23 valent vaccine  0.5 mL Intramuscular Tomorrow-1000  . risperiDONE  1 mg Oral QHS   Continuous Infusions:   Pamella Pertostin Chara Marquard, MD [facility] Pager [phone]. If 7 PM - 7 AM, please contact night-coverage at [email or web address], password [email or web address] 11/17/2014, 3:00 PM  LOS: 2 days

## 2014-11-17 NOTE — Progress Notes (Deleted)
PROGRESS NOTE  Dale Hunter EAV:[id number] DOB: 1965-10-08 DOA: 11/15/2014 PCP: Default, Provider, MD  HPI: Dale Hunter is a 49 year old male brought via EMS to ED on 11/15/2014 with an episode of severe diabetic hypoglycemia due to an insulin overdose as a suicide attempt.  Subjective / 24 H Interval events - Dale Hunter is very guarded today, he states he can't tell us anything because we will think he is nuts. When asked what we can do for him he stares at his hands and doesn't speak. - Began refusing care at 11/16/2014 at 1459, when asked why he started refusing all medications and food last night he had no response. - 11/17/2014 1306 allowed nursing staff to check CBG, recorded at 474 and allowed administration of insulin 15 units  Assessment/Plan: Principal Problem:   Non compliance w medication regimen Active Problems:   Diabetes mellitus   Insulin overdose   Hypoglycemia   Severe diabetic hypoglycemia   Swelling   Schizophrenia, paranoid   Pressure ulcer   Non-compliance with medication regimen - refusing any labs to be drawn, physical assessments by nursing staff,  medications, or food since 11/16/2014 - attempted reasoning with patient, 11/17/2014 1306 allowed nursing staff to check CBG, recorded at 474 and allowed administration of insulin 15 units - appreciate psychiatry help with this very difficult patient  Insulin Overdose in Setting of Suicidal Ideation - Patient admits to overdose as a suicide attempt, psychiatry consulted. - high risk to develop DKA if he continues to refuse CBGs / insulin administration   IDDM, with renal, neuropathic complications - s/p left BKA - sliding scale and Levemir 12 units qd at bedtime as he is now hyperglycemic - he refused his long acting last night - refusing labs  Schizophrenia, paranoid - risperidone ordered, patient refused - psych recommends inpatient psychiatric admission when medically stable - difficult to determine when he is  stable as he is not compliant with CBGs / insulin  RLE wounds - edema has decreased  - with history of cellulitis RLE in 2015, no active infection noted - patient unable to contribute more as to how he got the wounds  Mild AKI - suspect a degree of diabetic nephropathy   Diet: Diet regular Room service appropriate?: Yes; Fluid consistency:: Thin Fluids: none  DVT Prophylaxis: Heparin (refused)  Code Status: Full Code Family Communication: None Disposition Plan:   Consultants:  psychiatry  Procedures:  none   Antibiotics  Anti-infectives    None       Studies  Dg Tibia/fibula Right  11/15/2014   CLINICAL DATA:  Right lower leg pain and swelling.  EXAM: RIGHT TIBIA AND FIBULA - 2 VIEW  COMPARISON:  Prior radiographs and CTs.  FINDINGS: Extensive deformity and degenerative changes of the hindfoot again noted.  There is no evidence of acute fracture.  No new focal bony abnormalities are noted.  Soft tissue swelling is present.  No radiographic evidence of acute osteomyelitis identified.  IMPRESSION: Unchanged appearance of the right ankle with extensive deformity and degenerative changes of the hindfoot.  Soft tissue swelling without definite acute bony abnormality.   Electronically Signed   By: Harmon Pier M.D.   On: 11/15/2014 23:00   Dg Foot Complete Right  11/15/2014   CLINICAL DATA:  Chronic right foot pain. Current history of diabetes. Initial encounter.  EXAM: RIGHT FOOT COMPLETE - 3+ VIEW  COMPARISON:  Right foot radiographs performed 08/19/2014  FINDINGS: No definite acute osseous erosions are seen to  suggest osteomyelitis.  Charcot joint is noted at the hindfoot, somewhat worsened from the prior study, with loss of visualization of the tibiotalar and subtalar joint. Irregularity along the medial aspect of the base of the first proximal phalanx is stable and likely reflects degenerative change. The midfoot appears grossly intact. An os peroneum is noted.  There is mild  diffuse osteopenia of visualized osseous structures. Mild soft tissue swelling is noted about the hindfoot.  IMPRESSION: 1. No acute osseous erosions seen. 2. Charcot joint at the hindfoot, somewhat worsened from the prior study. 3. Mild diffuse osteopenia of visualized osseous structures. 4. Os peroneum noted.   Electronically Signed   By: Roanna Raider M.D.   On: 11/15/2014 22:59    Objective  Filed Vitals:   11/16/14 1755 11/16/14 2220 11/17/14 0525 11/17/14 0545  BP: 117/57 155/83 173/144 126/85  Pulse: 106 99 115 101  Temp: 97.5 F (36.4 C) 98.3 F (36.8 C)    TempSrc: Oral Axillary    Resp: Height:  (1.778 m)     Weight: 71.623 kg (157 lb 14.4 oz)     SpO2: 99% 98%  100%    Intake/Output Summary (Last 24 hours) at 11/17/14 1230 Last data filed at 11/17/14 0932  Gross per 24 hour  Intake    120 ml  Output   1300 ml  Net  -1180 ml   Filed Weights   11/16/14 0030 11/16/14 1755  Weight: 71.3 kg (157 lb 3 oz) 71.623 kg (157 lb 14.4 oz)    Exam:  General:  Dale Hunter was resting in bed, but visibly uncomfortable with provider presence in room  HEENT: no scleral icterus, PERRL  Cardiovascular: RRR without MRG, 2+ peripheral pulses, edema RLE improved  Respiratory: CTA biL, good air movement, no wheezing, no crackles, no rales  Abdomen: soft, non tender, BS +, no guarding  MSK/Extremities: no clubbing/cyanosis, no joint swelling  Skin: no rashes  Neuro: moves all 3  Data Reviewed: Basic Metabolic Panel:  Recent Labs Lab 11/15/14 2350 11/16/14 0336 11/16/14 0902 11/16/14 1146 11/16/14 1532  NA 136 136 135 135 136  K 3.7 4.2 4.5 4.4 4.3  CL 106 103 105 104 104  CO2 21* 24 21* 21* 22  GLUCOSE 50* 146* 327* 305* 258*  BUN CREATININE 0.97 1.09 1.09 1.25* 1.43*  CALCIUM 8.4* 8.4* 8.5* 8.4* 8.4*   Liver Function Tests:  Recent Labs Lab 11/15/14 1953  AST 30  ALT 19  ALKPHOS 69  BILITOT 0.5  PROT 6.2*  ALBUMIN 3.2*     CBC:  Recent Labs Lab 11/15/14 1953  WBC 7.6  NEUTROABS 5.7  HGB 11.4*  HCT 35.0*  MCV 87.9  PLT 208   CBG:  Recent Labs Lab 11/16/14 1634 11/16/14 2005 11/16/14 2159 11/17/14 0006 11/17/14 0522  GLUCAP 205* 183* 181* 93 324*    Recent Results (from the past 240 hour(s))  Blood culture (routine x 2)     Status: None (Preliminary result)   Collection Time: 11/15/14  7:53 PM  Result Value Ref Range Status   Specimen Description BLOOD LEFT FOREARM  Final   Special Requests BOTTLES DRAWN AEROBIC AND ANAEROBIC 5CC EA  Final   Culture   Final           BLOOD CULTURE RECEIVED NO GROWTH TO DATE CULTURE WILL BE HELD FOR 5 DAYS BEFORE ISSUING A FINAL NEGATIVE REPORT Performed at [facility]    Report Status PENDING  Incomplete  Blood culture (routine x 2)     Status: None (Preliminary result)   Collection Time: 11/15/14  8:05 PM  Result Value Ref Range Status   Specimen Description BLOOD LEFT HAND  Final   Special Requests BOTTLES DRAWN AEROBIC AND ANAEROBIC 10CC EA  Final   Culture   Final           BLOOD CULTURE RECEIVED NO GROWTH TO DATE CULTURE WILL BE HELD FOR 5 DAYS BEFORE ISSUING A FINAL NEGATIVE REPORT Performed at [facility]    Report Status PENDING  Incomplete  Urine culture     Status: None   Collection Time: 11/15/14  9:47 PM  Result Value Ref Range Status   Specimen Description URINE, CLEAN CATCH  Final   Special Requests NONE  Final   Colony Count   Final    5,000 COLONIES/ML Performed at [facility]    Culture   Final    INSIGNIFICANT GROWTH Performed at [facility]    Report Status 11/17/2014 FINAL  Final  MRSA PCR Screening     Status: None   Collection Time: 11/16/14 12:26 AM  Result Value Ref Range Status   MRSA by PCR NEGATIVE NEGATIVE Final    Comment:        The GeneXpert MRSA Assay (FDA approved for NASAL specimens only), is one component of a comprehensive MRSA colonization surveillance program.  It is not intended to diagnose MRSA infection nor to guide or monitor treatment for MRSA infections.      Scheduled Meds: . heparin  5,000 Units Subcutaneous 3 times per day  . insulin aspart  0-9 Units Subcutaneous 6 times per day  . insulin detemir  12 Units Subcutaneous QHS  . insulin detemir  6 Units Subcutaneous Once  . pneumococcal 23 valent vaccine  0.5 mL Intramuscular Tomorrow-1000  . risperiDONE  1 mg Oral QHS   Continuous Infusions:   Pamella Pertostin Gherghe, MD [facility] Pager [phone]. If 7 PM - 7 AM, please contact night-coverage at [email or web address], password [email or web address] 11/17/2014, 12:30 PM  LOS: 2 days

## 2014-11-17 NOTE — Progress Notes (Signed)
Inpatient Diabetes Program Recommendations  AACE/ADA: New Consensus Statement on Inpatient Glycemic Control (2013)  Target Ranges:  Prepandial:   less than 140 mg/dL      Peak postprandial:   less than 180 mg/dL (1-2 hours)      Critically ill patients:  140 - 180 mg/dL   It is unclear if pt has been diagnosed with type 1 or type 2 dm-appears to be a type 2 per below.  Inpatient Diabetes Program Recommendations Insulin - Basal: Noted pt refused the 6 units levemir ordered for midnight last HS (0001 this am) Basal then not given due to not being able to check cbg. May need to draw lab glucose in order to check levels, as pt may develop HHNk or DKA (although pt does not appear to be type 1) Correction (SSI): Pt has not gotten any basal insulin, however B-Met does not reveal any acidosis thus far today since using only correction insulin.   May need check lab glucose levels to maintain adequate insulin blood levels to meet basal needs.  Thank you Rosita Kea, RN, MSN, CDE  Diabetes Inpatient Program Office: [phone] Pager: [phone] 8:00 am to 5:00 pm

## 2014-11-17 NOTE — Progress Notes (Signed)
Pt refusing to have CBG checked. MD notified. Will continue to monitor and try to get CBGs.

## 2014-11-17 NOTE — Progress Notes (Signed)
No PCCM issues identified Will sign off. Please call if we can be of further assistance  Billy Fischeravid Laketia Vicknair, MD ; [facility] Mobile [phone].  After 5:30 PM or weekends, call [phone]

## 2014-11-18 LAB — BASIC METABOLIC PANEL
ANION GAP: 15 (ref 5–15)
BUN: 19 mg/dL (ref 6–20)
CALCIUM: 9 mg/dL (ref 8.9–10.3)
CO2: 20 mmol/L — AB (ref 22–32)
Chloride: 105 mmol/L (ref 101–111)
Creatinine, Ser: 1.45 mg/dL — ABNORMAL HIGH (ref 0.61–1.24)
GFR calc Af Amer: >60 mL/min (ref >60)
GFR, EST NON AFRICAN AMERICAN: 56 mL/min — AB (ref >60)
GLUCOSE: 437 mg/dL — AB (ref 65–99)
Potassium: 4.6 mmol/L (ref 3.5–5.1)
SODIUM: 140 mmol/L (ref 135–145)

## 2014-11-18 LAB — CBC
HEMATOCRIT: 32.7 % — AB (ref 39.0–52.0)
Hemoglobin: 10.6 g/dL — ABNORMAL LOW (ref 13.0–17.0)
MCH: 29.2 pg (ref 26.0–34.0)
MCHC: 32.4 g/dL (ref 30.0–36.0)
MCV: 90.1 fL (ref 78.0–100.0)
Platelets: 223 10*3/uL (ref 150–400)
RBC: 3.63 MIL/uL — ABNORMAL LOW (ref 4.22–5.81)
RDW: 13.8 % (ref 11.5–15.5)
WBC: 7 10*3/uL (ref 4.0–10.5)

## 2014-11-18 LAB — GLUCOSE, CAPILLARY
GLUCOSE-CAPILLARY: 352 mg/dL — AB (ref 65–99)
GLUCOSE-CAPILLARY: 161 mg/dL — AB (ref 65–99)
Glucose-Capillary: 114 mg/dL — ABNORMAL HIGH (ref 65–99)
Glucose-Capillary: 260 mg/dL — ABNORMAL HIGH (ref 65–99)
Glucose-Capillary: 390 mg/dL — ABNORMAL HIGH (ref 65–99)
Glucose-Capillary: 93 mg/dL (ref 65–99)

## 2014-11-18 MED ORDER — INSULIN DETEMIR 100 UNIT/ML ~~LOC~~ SOLN
12.0000 [IU] | Freq: Every day | SUBCUTANEOUS | Status: DC
  Administered 2014-11-18: 12 [IU] via SUBCUTANEOUS
  Filled 2014-11-18 (×2): qty 0.12

## 2014-11-18 MED ORDER — RISPERIDONE MICROSPHERES 25 MG IM SUSR
25.0000 mg | INTRAMUSCULAR | Status: DC
  Administered 2014-11-19: 25 mg via INTRAMUSCULAR
  Filled 2014-11-18 (×3): qty 2

## 2014-11-18 NOTE — Progress Notes (Signed)
Patient ID: Dale Hunter, male   DOB: 08/29/1965, 49 y.o.   MRN: [id number]  Patient has been suffering with paranoid schizophrenia with disorganized thought process and dangerous to himself without treatment. He has been refusing medical care including medications. He has made suicidee attempt as a part of his paranoid delusions. Agree with patient been forced medications, depot anti psychiotic will be more preferable.   Recommend Risperidone Consta 25 mg IM today.  Continue Risperidone Sol Tab 1mg  PO Qhs Monitor for EPS  Dale Hunter,JANARDHAHA R. 11/18/2014 1:00 PM

## 2014-11-18 NOTE — Progress Notes (Signed)
PROGRESS NOTE  Dale Hunter ZOX:[id number] DOB: 1966-05-11 DOA: 11/15/2014 PCP: Default, Provider, MD  HPI: [name] is a 49 year old male brought via EMS to ED on 11/15/2014 with an episode of severe diabetic hypoglycemia due to an insulin overdose as a suicide attempt.  Subjective / 24 H Interval events - continues to refuse medical treatment, cannot offer a coherent explanation  Assessment/Plan: Principal Problem:   Non compliance w medication regimen Active Problems:   Diabetes mellitus   Insulin overdose   Hypoglycemia   Severe diabetic hypoglycemia   Swelling   Schizophrenia, paranoid   Pressure ulcer   Non-compliance with medication regimen - intermittently refusing any labs to be drawn, physical assessments by nursing staff,  medications, or food  - appreciate psychiatry help with this very difficult patient - discussed with Dr. Ozella Rocks today, will start Risperidone IM  Insulin Overdose in Setting of Suicidal Ideation - Patient admits to overdose as a suicide attempt, psychiatry consulted. - high risk to develop DKA if he continues to refuse CBGs / insulin administration  - intermittently allowing CBGs / insulin - Levemir STAT as he hasn't gotten it for 3 days   IDDM, with renal, neuropathic complications - s/p left BKA - refusing insulin/CBGs  Schizophrenia, paranoid - risperidone im  RLE wounds - edema has decreased  - with history of cellulitis RLE in 2015, no active infection noted  Mild AKI - suspect a degree of diabetic nephropathy, stable   Diet: Diet regular Room service appropriate?: Yes; Fluid consistency:: Thin Fluids: none  DVT Prophylaxis: Heparin (refused)  Code Status: Full Code Family Communication: None Disposition Plan: psych facility once CBGs better  Consultants:  Psychiatry  Procedures:  none   Antibiotics  Anti-infectives    None       Studies  No results found.  Objective  Filed Vitals:   11/17/14 1455  11/17/14 2149 11/18/14 0546 11/18/14 1404  BP: 128/62 118/70 153/99 155/82  Pulse: 113 102 100 94  Temp: 98.9 F (37.2 C) 98.2 F (36.8 C) 97.5 F (36.4 C) 98.4 F (36.9 C)  TempSrc: Oral Oral Oral Oral  Resp: Height:      Weight:      SpO2: 97% 95% 100% 98%    Intake/Output Summary (Last 24 hours) at 11/18/14 1451 Last data filed at 11/18/14 1149  Gross per 24 hour  Intake     15 ml  Output      0 ml  Net     15 ml   Filed Weights   11/16/14 0030 11/16/14 1755  Weight: 71.3 kg (157 lb 3 oz) 71.623 kg (157 lb 14.4 oz)    Exam: Would not cooperate   Data Reviewed: Basic Metabolic Panel:  Recent Labs Lab 11/16/14 0336 11/16/14 0902 11/16/14 1146 11/16/14 1532 11/18/14 0840  NA 136 135 135 136 140  K 4.2 4.5 4.4 4.3 4.6  CL 103 105 104 104 105  CO2 24 21* 21* 22 20*  GLUCOSE 146* 327* 305* 258* 437*  BUN CREATININE 1.09 1.09 1.25* 1.43* 1.45*  CALCIUM 8.4* 8.5* 8.4* 8.4* 9.0   Liver Function Tests:  Recent Labs Lab 11/15/14 1953  AST 30  ALT 19  ALKPHOS 69  BILITOT 0.5  PROT 6.2*  ALBUMIN 3.2*   CBC:  Recent Labs Lab 11/15/14 1953 11/18/14 0840  WBC 7.6 7.0  NEUTROABS 5.7  --   HGB 11.4* 10.6*  HCT 35.0* 32.7*  MCV 87.9 90.1  PLT 208 223   CBG:  Recent Labs Lab 11/17/14 2035 11/18/14 0008 11/18/14 0422 11/18/14 0822 11/18/14 1209  GLUCAP 121* 114* 260* 390* 352*    Recent Results (from the past 240 hour(s))  Blood culture (routine x 2)     Status: None (Preliminary result)   Collection Time: 11/15/14  7:53 PM  Result Value Ref Range Status   Specimen Description BLOOD LEFT FOREARM  Final   Special Requests BOTTLES DRAWN AEROBIC AND ANAEROBIC 5CC EA  Final   Culture   Final           BLOOD CULTURE RECEIVED NO GROWTH TO DATE CULTURE WILL BE HELD FOR 5 DAYS BEFORE ISSUING A FINAL NEGATIVE REPORT [facility]    Report Status PENDING  Incomplete  Blood culture (routine x 2)      Status: None (Preliminary result)   Collection Time: 11/15/14  8:05 PM  Result Value Ref Range Status   Specimen Description BLOOD LEFT HAND  Final   Special Requests BOTTLES DRAWN AEROBIC AND ANAEROBIC 10CC EA  Final   Culture   Final           BLOOD CULTURE RECEIVED NO GROWTH TO DATE CULTURE WILL BE HELD FOR 5 DAYS BEFORE ISSUING A FINAL NEGATIVE REPORT Performed at [facility]    Report Status PENDING  Incomplete  Urine culture     Status: None   Collection Time: 11/15/14  9:47 PM  Result Value Ref Range Status   Specimen Description URINE, CLEAN CATCH  Final   Special Requests NONE  Final   Colony Count   Final    5,000 COLONIES/ML Performed at [facility]    Culture   Final    INSIGNIFICANT GROWTH Performed at [facility]    Report Status 11/17/2014 FINAL  Final  MRSA PCR Screening     Status: None   Collection Time: 11/16/14 12:26 AM  Result Value Ref Range Status   MRSA by PCR NEGATIVE NEGATIVE Final    Comment:        The GeneXpert MRSA Assay (FDA approved for NASAL specimens only), is one component of a comprehensive MRSA colonization surveillance program. It is not intended to diagnose MRSA infection nor to guide or monitor treatment for MRSA infections.      Scheduled Meds: . heparin  5,000 Units Subcutaneous 3 times per day  . insulin aspart  0-9 Units Subcutaneous 6 times per day  . insulin detemir  12 Units Subcutaneous Daily  . pneumococcal 23 valent vaccine  0.5 mL Intramuscular Tomorrow-1000  . risperiDONE microspheres  25 mg Intramuscular Q14 Days   Continuous Infusions:   Time spent: 15 minutes  Pamella Pertostin Gherghe, MD [facility] Pager [phone]. If 7 PM - 7 AM, please contact night-coverage at [email or web address], password [email or web address] 11/18/2014, 2:51 PM  LOS: 3 days

## 2014-11-19 LAB — GLUCOSE, CAPILLARY
GLUCOSE-CAPILLARY: 136 mg/dL — AB (ref 65–99)
Glucose-Capillary: 119 mg/dL — ABNORMAL HIGH (ref 65–99)
Glucose-Capillary: 135 mg/dL — ABNORMAL HIGH (ref 65–99)
Glucose-Capillary: 163 mg/dL — ABNORMAL HIGH (ref 65–99)
Glucose-Capillary: 167 mg/dL — ABNORMAL HIGH (ref 65–99)
Glucose-Capillary: 24 mg/dL — CL (ref 65–99)
Glucose-Capillary: 246 mg/dL — ABNORMAL HIGH (ref 65–99)
Glucose-Capillary: 38 mg/dL — CL (ref 65–99)

## 2014-11-19 LAB — HEMOGLOBIN A1C
Hgb A1c MFr Bld: 10.4 % — ABNORMAL HIGH (ref 4.8–5.6)
Mean Plasma Glucose: 252 mg/dL

## 2014-11-19 MED ORDER — DEXTROSE 50 % IV SOLN: 1.0000 | Freq: Once | INTRAVENOUS | Status: DC

## 2014-11-19 MED ORDER — DEXTROSE 50 % IV SOLN
INTRAVENOUS | Status: AC
  Filled 2014-11-19: qty 50

## 2014-11-19 MED ORDER — INSULIN DETEMIR 100 UNIT/ML ~~LOC~~ SOLN
8.0000 [IU] | Freq: Every day | SUBCUTANEOUS | Status: DC
  Administered 2014-11-19: 8 [IU] via SUBCUTANEOUS
  Filled 2014-11-19: qty 0.08

## 2014-11-19 NOTE — Progress Notes (Signed)
PROGRESS NOTE  Dale BattlesScott Raburn ZOX:[id number] DOB: 03/24/1966 DOA: 11/15/2014 PCP: Default, Provider, MD  HPI: [name] is a [age] year old male brought via EMS to ED on 11/15/2014 with an episode of severe diabetic hypoglycemia due to an insulin overdose as a suicide attempt.  Subjective / 24 H Interval events - hypoglycemic overnight - getting his medications now but refuses to eat  Assessment/Plan: Principal Problem:   Non compliance w medication regimen Active Problems:   Diabetes mellitus   Insulin overdose   Hypoglycemia   Severe diabetic hypoglycemia   Swelling   Schizophrenia, paranoid   Pressure ulcer   Non-compliance with medication regimen - appreciate psychiatry help with this very difficult patient - discussed with Dr. Ozella RocksJonnlagadda 6/3, now on Risperidone IM  IDDM, with renal, neuropathic complications / Insulin Overdose in Setting of Suicidal Ideation - Patient admits to overdose as a suicide attempt, psychiatry consulted. - able to administer insulin now, not eating and had a hypoglycemic episode last night, will decrease his Levemir  Schizophrenia, paranoid - risperidone im  RLE wounds - edema has decreased  - with history of cellulitis RLE in 2015, no active infection noted  Mild AKI - suspect a degree of diabetic nephropathy, stable   Diet: Diet regular Room service appropriate?: Yes; Fluid consistency:: Thin Fluids: none  DVT Prophylaxis: Heparin (refused)  Code Status: Full Code Family Communication: None Disposition Plan: psych facility once CBGs better  Consultants:  Psychiatry  Procedures:  none   Antibiotics  Anti-infectives    None       Studies  No results found.  Objective  Filed Vitals:   11/17/14 2149 11/18/14 0546 11/18/14 1404 11/18/14 2143  BP: 118/70 153/99 155/82 138/82  Pulse: 102 100 94 96  Temp: 98.2 F (36.8 C) 97.5 F (36.4 C) 98.4 F (36.9 C) 98.5 F (36.9 C)  TempSrc: Oral Oral Oral Oral  Resp: 17 16  16 17   Height:      Weight:      SpO2: 95% 100% 98% 98%    Intake/Output Summary (Last 24 hours) at 11/19/14 1145 Last data filed at 11/19/14 0756  Gross per 24 hour  Intake     35 ml  Output    200 ml  Net   -165 ml   Filed Weights   11/16/14 0030 11/16/14 1755  Weight: 71.3 kg (157 lb 3 oz) 71.623 kg (157 lb 14.4 oz)    Exam: GEN: NAD CV: RRR without murmurs, 2+ pulses, no edema Pulm: CTA biL, no wheezing Psych: flight of ideas GI: soft, non tender   Data Reviewed: Basic Metabolic Panel:  Recent Labs Lab 11/16/14 0336 11/16/14 0902 11/16/14 1146 11/16/14 1532 11/18/14 0840  NA 136 135 135 136 140  K 4.2 4.5 4.4 4.3 4.6  CL 103 105 104 104 105  CO2 24 21* 21* 22 20*  GLUCOSE 146* 327* 305* 258* 437*  BUN 17 16 16 19 19   CREATININE 1.09 1.09 1.25* 1.43* 1.45*  CALCIUM 8.4* 8.5* 8.4* 8.4* 9.0   Liver Function Tests:  Recent Labs Lab 11/15/14 1953  AST 30  ALT 19  ALKPHOS 69  BILITOT 0.5  PROT 6.2*  ALBUMIN 3.2*   CBC:  Recent Labs Lab 11/15/14 1953 11/18/14 0840  WBC 7.6 7.0  NEUTROABS 5.7  --   HGB 11.4* 10.6*  HCT 35.0* 32.7*  MCV 87.9 90.1  PLT 208 223   CBG:  Recent Labs Lab 11/18/14 1543 11/19/14 0020 11/19/14  0055 11/19/14 0539 11/19/14 0801  GLUCAP 161* 24* 163* 135* 167*    Recent Results (from the past 240 hour(s))  Blood culture (routine x 2)     Status: None (Preliminary result)   Collection Time: 11/15/14  7:53 PM  Result Value Ref Range Status   Specimen Description BLOOD LEFT FOREARM  Final   Special Requests BOTTLES DRAWN AEROBIC AND ANAEROBIC 5CC EA  Final   Culture   Final           BLOOD CULTURE RECEIVED NO GROWTH TO DATE CULTURE WILL BE HELD FOR 5 DAYS BEFORE ISSUING A FINAL NEGATIVE REPORT Performed at [facility]    Report Status PENDING  Incomplete  Blood culture (routine x 2)     Status: None (Preliminary result)   Collection Time: 11/15/14  8:05 PM  Result Value Ref Range Status   Specimen  Description BLOOD LEFT HAND  Final   Special Requests BOTTLES DRAWN AEROBIC AND ANAEROBIC 10CC EA  Final   Culture   Final           BLOOD CULTURE RECEIVED NO GROWTH TO DATE CULTURE WILL BE HELD FOR 5 DAYS BEFORE ISSUING A FINAL NEGATIVE REPORT Performed at [facility]    Report Status PENDING  Incomplete  Urine culture     Status: None   Collection Time: 11/15/14  9:47 PM  Result Value Ref Range Status   Specimen Description URINE, CLEAN CATCH  Final   Special Requests NONE  Final   Colony Count   Final    5,000 COLONIES/ML Performed at [facility]    Culture   Final    INSIGNIFICANT GROWTH Performed at [facility]    Report Status 11/17/2014 FINAL  Final  MRSA PCR Screening     Status: None   Collection Time: 11/16/14 12:26 AM  Result Value Ref Range Status   MRSA by PCR NEGATIVE NEGATIVE Final    Comment:        The GeneXpert MRSA Assay (FDA approved for NASAL specimens only), is one component of a comprehensive MRSA colonization surveillance program. It is not intended to diagnose MRSA infection nor to guide or monitor treatment for MRSA infections.      Scheduled Meds: . dextrose      . heparin  5,000 Units Subcutaneous 3 times per day  . insulin aspart  0-9 Units Subcutaneous 6 times per day  . insulin detemir  8 Units Subcutaneous Daily  . pneumococcal 23 valent vaccine  0.5 mL Intramuscular Tomorrow-1000  . risperiDONE microspheres  25 mg Intramuscular Q14 Days   Continuous Infusions:    Pamella Pert, MD [facility] Pager [phone]. If 7 PM - 7 AM, please contact night-coverage at [email or web address], password [facility] 11/19/2014, 11:45 AM  LOS: 4 days

## 2014-11-20 LAB — GLUCOSE, CAPILLARY
GLUCOSE-CAPILLARY: 156 mg/dL — AB (ref 65–99)
GLUCOSE-CAPILLARY: 238 mg/dL — AB (ref 65–99)
GLUCOSE-CAPILLARY: 292 mg/dL — AB (ref 65–99)
Glucose-Capillary: 95 mg/dL (ref 65–99)
Glucose-Capillary: 249 mg/dL — ABNORMAL HIGH (ref 65–99)
Glucose-Capillary: 253 mg/dL — ABNORMAL HIGH (ref 65–99)

## 2014-11-20 MED ORDER — INSULIN DETEMIR 100 UNIT/ML ~~LOC~~ SOLN
6.0000 [IU] | Freq: Every day | SUBCUTANEOUS | Status: DC
  Administered 2014-11-20 – 2014-12-07 (×16): 6 [IU] via SUBCUTANEOUS
  Filled 2014-11-20 (×19): qty 0.06

## 2014-11-20 NOTE — Progress Notes (Signed)
PROGRESS NOTE  Dale Hunter ZHY:[id number]:[id number] DOB: 05/12/1966 DOA: 11/15/2014 PCP: Default, Provider, MD  HPI: [name] is a 49 year old male brought via EMS to ED on 11/15/2014 with an episode of severe diabetic hypoglycemia due to an insulin overdose as a suicide attempt.  Subjective / 24 H Interval events - hypoglycemic overnight again, refuses to eat   Assessment/Plan: Principal Problem:   Non compliance w medication regimen Active Problems:   Diabetes mellitus   Insulin overdose   Hypoglycemia   Severe diabetic hypoglycemia   Swelling   Schizophrenia, paranoid   Pressure ulcer   Non-compliance with medication regimen - appreciate psychiatry help with this very difficult patient - discussed with Dr. Ozella RocksJonnlagadda 6/3, now on Risperidone IM  IDDM, with renal, neuropathic complications / Insulin Overdose in Setting of Suicidal Ideation - Patient admits to overdose as a suicide attempt, psychiatry consulted. - hypoglycemia again, decrease Levemir further  - A1C 10.4 showing poor control - if no further hypoglycemic episodes tonight will be ready for discharge as soon as a psych bed available  Schizophrenia, paranoid - risperidone im  RLE wounds - edema has decreased  - with history of cellulitis RLE in 2015, no active infection noted  Mild AKI - suspect a degree of diabetic nephropathy, stable   Diet: Diet regular Room service appropriate?: Yes; Fluid consistency:: Thin Fluids: none  DVT Prophylaxis: Heparin  Code Status: Full Code Family Communication: None Disposition Plan: psych facility once CBGs better  Consultants:  Psychiatry  Procedures:  none   Antibiotics  Anti-infectives    None       Studies  No results found.  Objective  Filed Vitals:   11/18/14 0546 11/18/14 1404 11/18/14 2143 11/20/14 0631  BP: 153/99 155/82 138/82 130/78  Pulse: 100 94 96 91  Temp: 97.5 F (36.4 C) 98.4 F (36.9 C) 98.5 F (36.9 C)   TempSrc: Oral Oral  Oral   Resp: 16 16 17 16   Height:      Weight:      SpO2: 100% 98% 98%     Intake/Output Summary (Last 24 hours) at 11/20/14 1154 Last data filed at 11/20/14 0800  Gross per 24 hour  Intake      0 ml  Output    250 ml  Net   -250 ml   Filed Weights   11/16/14 0030 11/16/14 1755  Weight: 71.3 kg (157 lb 3 oz) 71.623 kg (157 lb 14.4 oz)    Exam: GEN: NAD CV: RRR without murmurs, 2+ pulses, no edema Pulm: CTA biL, no wheezing Psych: flight of ideas GI: soft, non tender   Data Reviewed: Basic Metabolic Panel:  Recent Labs Lab 11/16/14 0336 11/16/14 0902 11/16/14 1146 11/16/14 1532 11/18/14 0840  NA 136 135 135 136 140  K 4.2 4.5 4.4 4.3 4.6  CL 103 105 104 104 105  CO2 24 21* 21* 22 20*  GLUCOSE 146* 327* 305* 258* 437*  BUN 17 16 16 19 19   CREATININE 1.09 1.09 1.25* 1.43* 1.45*  CALCIUM 8.4* 8.5* 8.4* 8.4* 9.0   Liver Function Tests:  Recent Labs Lab 11/15/14 1953  AST 30  ALT 19  ALKPHOS 69  BILITOT 0.5  PROT 6.2*  ALBUMIN 3.2*   CBC:  Recent Labs Lab 11/15/14 1953 11/18/14 0840  WBC 7.6 7.0  NEUTROABS 5.7  --   HGB 11.4* 10.6*  HCT 35.0* 32.7*  MCV 87.9 90.1  PLT 208 223   CBG:  Recent Labs  Lab 11/19/14 1554 11/19/14 2010 11/19/14 2155 11/20/14 0427 11/20/14 0826  GLUCAP 136* 38* 119* 95 156*    Recent Results (from the past 240 hour(s))  Blood culture (routine x 2)     Status: None (Preliminary result)   Collection Time: 11/15/14  7:53 PM  Result Value Ref Range Status   Specimen Description BLOOD LEFT FOREARM  Final   Special Requests BOTTLES DRAWN AEROBIC AND ANAEROBIC 5CC EA  Final   Culture   Final           BLOOD CULTURE RECEIVED NO GROWTH TO DATE CULTURE WILL BE HELD FOR 5 DAYS BEFORE ISSUING A FINAL NEGATIVE REPORT Performed at [facility]    Report Status PENDING  Incomplete  Blood culture (routine x 2)     Status: None (Preliminary result)   Collection Time: 11/15/14  8:05 PM  Result Value Ref Range  Status   Specimen Description BLOOD LEFT HAND  Final   Special Requests BOTTLES DRAWN AEROBIC AND ANAEROBIC 10CC EA  Final   Culture   Final           BLOOD CULTURE RECEIVED NO GROWTH TO DATE CULTURE WILL BE HELD FOR 5 DAYS BEFORE ISSUING A FINAL NEGATIVE REPORT Performed at [facility]    Report Status PENDING  Incomplete  Urine culture     Status: None   Collection Time: 11/15/14  9:47 PM  Result Value Ref Range Status   Specimen Description URINE, CLEAN CATCH  Final   Special Requests NONE  Final   Colony Count   Final    5,000 COLONIES/ML Performed at [facility]    Culture   Final    INSIGNIFICANT GROWTH Performed at [facility]    Report Status 11/17/2014 FINAL  Final  MRSA PCR Screening     Status: None   Collection Time: 11/16/14 12:26 AM  Result Value Ref Range Status   MRSA by PCR NEGATIVE NEGATIVE Final    Comment:        The GeneXpert MRSA Assay (FDA approved for NASAL specimens only), is one component of a comprehensive MRSA colonization surveillance program. It is not intended to diagnose MRSA infection nor to guide or monitor treatment for MRSA infections.      Scheduled Meds: . heparin  5,000 Units Subcutaneous 3 times per day  . insulin aspart  0-9 Units Subcutaneous 6 times per day  . insulin detemir  6 Units Subcutaneous Daily  . pneumococcal 23 valent vaccine  0.5 mL Intramuscular Tomorrow-1000  . risperiDONE microspheres  25 mg Intramuscular Q14 Days   Continuous Infusions:    Pamella Pert, MD [facility] Pager [phone]. If 7 PM - 7 AM, please contact night-coverage at [email or web address], password [facility] 11/20/2014, 11:54 AM  LOS: 5 days

## 2014-11-21 LAB — BASIC METABOLIC PANEL
Anion gap: 15 (ref 5–15)
BUN: 18 mg/dL (ref 6–20)
CO2: 27 mmol/L (ref 22–32)
CREATININE: 1.42 mg/dL — AB (ref 0.61–1.24)
Calcium: 9.7 mg/dL (ref 8.9–10.3)
Chloride: 102 mmol/L (ref 101–111)
GFR, EST NON AFRICAN AMERICAN: 58 mL/min — AB (ref >60)
Glucose, Bld: 97 mg/dL (ref 65–99)
Potassium: 3.5 mmol/L (ref 3.5–5.1)
SODIUM: 144 mmol/L (ref 135–145)

## 2014-11-21 LAB — GLUCOSE, CAPILLARY
GLUCOSE-CAPILLARY: 111 mg/dL — AB (ref 65–99)
GLUCOSE-CAPILLARY: 225 mg/dL — AB (ref 65–99)
Glucose-Capillary: 190 mg/dL — ABNORMAL HIGH (ref 65–99)
Glucose-Capillary: 267 mg/dL — ABNORMAL HIGH (ref 65–99)
Glucose-Capillary: 130 mg/dL — ABNORMAL HIGH (ref 65–99)
Glucose-Capillary: 111 mg/dL — ABNORMAL HIGH (ref 65–99)

## 2014-11-21 LAB — URINALYSIS, ROUTINE W REFLEX MICROSCOPIC
BILIRUBIN URINE: NEGATIVE
Glucose, UA: >1000 mg/dL — AB
Ketones, ur: 15 mg/dL — AB
Leukocytes, UA: NEGATIVE
Nitrite: NEGATIVE
PH: 5.5 (ref 5.0–8.0)
PROTEIN: 100 mg/dL — AB
Specific Gravity, Urine: 1.026 (ref 1.005–1.030)
UROBILINOGEN UA: 0.2 mg/dL (ref 0.0–1.0)

## 2014-11-21 LAB — URINE MICROSCOPIC-ADD ON

## 2014-11-21 MED ORDER — SODIUM CHLORIDE 0.9 % IV SOLN
INTRAVENOUS | Status: DC
  Administered 2014-11-21 – 2014-11-23 (×5): via INTRAVENOUS
  Administered 2014-11-24: 1000 mL via INTRAVENOUS
  Administered 2014-11-25 – 2014-11-26 (×5): via INTRAVENOUS

## 2014-11-21 MED ORDER — ZIPRASIDONE MESYLATE 20 MG IM SOLR
10.0000 mg | Freq: Four times a day (QID) | INTRAMUSCULAR | Status: DC | PRN
  Administered 2014-11-24: 10 mg via INTRAMUSCULAR
  Filled 2014-11-21 (×3): qty 20

## 2014-11-21 MED ORDER — RISPERIDONE 1 MG PO TBDP
1.0000 mg | ORAL_TABLET | Freq: Two times a day (BID) | ORAL | Status: DC
  Administered 2014-11-21 – 2014-11-25 (×7): 1 mg via ORAL
  Filled 2014-11-21 (×10): qty 1

## 2014-11-21 NOTE — Clinical Social Work Psych Note (Signed)
Psych CSW met with patient. Full assessment to follow.    Psych CSW continuing to seek inpatient psychiatric placement.    Disposition: Inpatient Psychiatric Hospitalization  Barriers to placement: wounds to right leg, ambulations (left BKA), non-compliance with insulin  Nonnie Done, LCSW [phone]  Psychiatric & Orthopedics (5N 1-8) Clinical Social Worker
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
## 2014-11-21 NOTE — Progress Notes (Signed)
PROGRESS NOTE  Dale BattlesScott Hunter ZOX:[id number] DOB: 05/10/1966 DOA: 11/15/2014 PCP: Default, Provider, MD  HPI: Dale Hunter is a 49 year old male brought via EMS to ED on 11/15/2014 with an episode of severe diabetic hypoglycemia due to an insulin overdose as a suicide attempt.  Subjective / 24 H Interval events - no longer having hypoglycemia - calm this morning, then increasing violent   Assessment/Plan: Principal Problem:   Non compliance w medication regimen Active Problems:   Diabetes mellitus   Insulin overdose   Hypoglycemia   Severe diabetic hypoglycemia   Swelling   Schizophrenia, paranoid   Pressure ulcer   Non-compliance with medication regimen - appreciate psychiatry help with this very difficult patient, increasingly violent today, geodon per psychiatry - discussed with Dr. Ozella RocksJonnlagadda 6/3 and again 6/6 - able to transfer to psychiatric facility as soon as bed is available  IDDM, with renal, neuropathic complications / Insulin Overdose in Setting of Suicidal Ideation - Patient admits to overdose as a suicide attempt, psychiatry consulted. - A1C 10.4 showing poor control - no further hypoglycemic episodes, from medicine standpoint can be transferred per psychiatry   Schizophrenia, paranoid - risperidone im  RLE wounds - edema has decreased  - with history of cellulitis RLE in 2015, no active infection noted  Mild AKI - suspect a degree of diabetic nephropathy, stable   Diet: Diet regular Room service appropriate?: Yes; Fluid consistency:: Thin Fluids: none  DVT Prophylaxis: Heparin  Code Status: Full Code Family Communication: None Disposition Plan: psych facility once CBGs better  Consultants:  Psychiatry  Procedures:  none   Antibiotics  Anti-infectives    None       Studies  No results found.  Objective  Filed Vitals:   11/20/14 0631 11/20/14 1632 11/20/14 2056 11/21/14 0623  BP: 130/78 144/90 115/94 150/96  Pulse: 91 94 99 96    Temp:  97.8 F (36.6 C) 98.2 F (36.8 C)   TempSrc:  Oral Oral   Resp: 16 18 17 16   Height:      Weight:      SpO2:  100% 98% 100%    Intake/Output Summary (Last 24 hours) at 11/21/14 1241 Last data filed at 11/21/14 0902  Gross per 24 hour  Intake    933 ml  Output    350 ml  Net    583 ml   Filed Weights   11/16/14 0030 11/16/14 1755  Weight: 71.3 kg (157 lb 3 oz) 71.623 kg (157 lb 14.4 oz)    Exam: GEN: NAD CV: RRR without murmurs, 2+ pulses, no edema Pulm: CTA biL, no wheezing Psych: flight of ideas GI: soft, non tender   Data Reviewed: Basic Metabolic Panel:  Recent Labs Lab 11/16/14 0336 11/16/14 0902 11/16/14 1146 11/16/14 1532 11/18/14 0840  NA 136 135 135 136 140  K 4.2 4.5 4.4 4.3 4.6  CL 103 105 104 104 105  CO2 24 21* 21* 22 20*  GLUCOSE 146* 327* 305* 258* 437*  BUN 17 16 16 19 19   CREATININE 1.09 1.09 1.25* 1.43* 1.45*  CALCIUM 8.4* 8.5* 8.4* 8.4* 9.0   Liver Function Tests:  Recent Labs Lab 11/15/14 1953  AST 30  ALT 19  ALKPHOS 69  BILITOT 0.5  PROT 6.2*  ALBUMIN 3.2*   CBC:  Recent Labs Lab 11/15/14 1953 11/18/14 0840  WBC 7.6 7.0  NEUTROABS 5.7  --   HGB 11.4* 10.6*  HCT 35.0* 32.7*  MCV 87.9 90.1  PLT  208 223   CBG:  Recent Labs Lab 11/20/14 2052 11/20/14 2353 11/21/14 0456 11/21/14 0814 11/21/14 1143  GLUCAP 253* 292* 190* 267* 225*    Recent Results (from the past 240 hour(s))  Blood culture (routine x 2)     Status: None (Preliminary result)   Collection Time: 11/15/14  7:53 PM  Result Value Ref Range Status   Specimen Description BLOOD LEFT FOREARM  Final   Special Requests BOTTLES DRAWN AEROBIC AND ANAEROBIC 5CC EA  Final   Culture   Final           BLOOD CULTURE RECEIVED NO GROWTH TO DATE CULTURE WILL BE HELD FOR 5 DAYS BEFORE ISSUING A FINAL NEGATIVE REPORT Performed at [facility]    Report Status PENDING  Incomplete  Blood culture (routine x 2)     Status: None (Preliminary result)    Collection Time: 11/15/14  8:05 PM  Result Value Ref Range Status   Specimen Description BLOOD LEFT HAND  Final   Special Requests BOTTLES DRAWN AEROBIC AND ANAEROBIC 10CC EA  Final   Culture   Final           BLOOD CULTURE RECEIVED NO GROWTH TO DATE CULTURE WILL BE HELD FOR 5 DAYS BEFORE ISSUING A FINAL NEGATIVE REPORT Performed at [facility]    Report Status PENDING  Incomplete  Urine culture     Status: None   Collection Time: 11/15/14  9:47 PM  Result Value Ref Range Status   Specimen Description URINE, CLEAN CATCH  Final   Special Requests NONE  Final   Colony Count   Final    5,000 COLONIES/ML Performed at [facility]    Culture   Final    INSIGNIFICANT GROWTH Performed at [facility]    Report Status 11/17/2014 FINAL  Final  MRSA PCR Screening     Status: None   Collection Time: 11/16/14 12:26 AM  Result Value Ref Range Status   MRSA by PCR NEGATIVE NEGATIVE Final    Comment:        The GeneXpert MRSA Assay (FDA approved for NASAL specimens only), is one component of a comprehensive MRSA colonization surveillance program. It is not intended to diagnose MRSA infection nor to guide or monitor treatment for MRSA infections.      Scheduled Meds: . heparin  5,000 Units Subcutaneous 3 times per day  . insulin aspart  0-9 Units Subcutaneous 6 times per day  . insulin detemir  6 Units Subcutaneous Daily  . pneumococcal 23 valent vaccine  0.5 mL Intramuscular Tomorrow-1000  . risperiDONE  1 mg Oral BID  . risperiDONE microspheres  25 mg Intramuscular Q14 Days   Continuous Infusions:    Pamella Pert, MD [facility] Pager [phone]. If 7 PM - 7 AM, please contact night-coverage at [email or web address], password [facility] 11/21/2014, 12:41 PM  LOS: 6 days
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
## 2014-11-21 NOTE — Care Management Note (Signed)
Case Management Note  Patient Details  Name: Dale Hunter MRN: [id number] Date of Birth: 06/26/1965  Subjective/Objective:    Patient for inpatient psych facility, [facility] Psych CSW following.    BS was a little high today as well. As soon as psych bed availble , patient will dc.            Action/Plan:   Expected Discharge Date:                  Expected Discharge Plan:  Psychiatric Hospital  In-House Referral:  Clinical Social Work  Discharge planning Services  CM Consult  Post Acute Care Choice:    Choice offered to:     DME Arranged:    DME Agency:     HH Arranged:    HH Agency:     Status of Service:  Completed, signed off  Medicare Important Message Given:  No Date Medicare IM Given:    Medicare IM give by:    Date Additional Medicare IM Given:    Additional Medicare Important Message give by:     If discussed at Long Length of Stay Meetings, dates discussed:    Additional Comments:  Leone Havenaylor, Dale Froman Clinton, RN 11/21/2014, 4:13 PM

## 2014-11-22 LAB — URINE CULTURE
CULTURE: NO GROWTH
Colony Count: NO GROWTH

## 2014-11-22 LAB — GLUCOSE, CAPILLARY
Glucose-Capillary: 204 mg/dL — ABNORMAL HIGH (ref 65–99)
Glucose-Capillary: 232 mg/dL — ABNORMAL HIGH (ref 65–99)
Glucose-Capillary: 166 mg/dL — ABNORMAL HIGH (ref 65–99)
Glucose-Capillary: 111 mg/dL — ABNORMAL HIGH (ref 65–99)
Glucose-Capillary: 188 mg/dL — ABNORMAL HIGH (ref 65–99)

## 2014-11-22 LAB — CULTURE, BLOOD (ROUTINE X 2)
CULTURE: NO GROWTH
Culture: NO GROWTH

## 2014-11-22 MED ORDER — TAMSULOSIN HCL 0.4 MG PO CAPS
0.4000 mg | ORAL_CAPSULE | Freq: Every day | ORAL | Status: DC
  Administered 2014-11-22 – 2014-11-28 (×2): 0.4 mg via ORAL
  Filled 2014-11-22 (×8): qty 1

## 2014-11-22 NOTE — Progress Notes (Addendum)
Pt's CBG 204. Pt refused to take his insulin. Pt's alert and oriented x4, being able to state his name, DOB, where he is, and what month it is.
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
## 2014-11-22 NOTE — Progress Notes (Signed)
PROGRESS NOTE  Dale Hunter ZOX:[id number] DOB: 04/30/1966 DOA: 11/15/2014 PCP: Default, Provider, MD  HPI: [name] is a 49 year old male brought via EMS to ED on 11/15/2014 with an episode of severe diabetic hypoglycemia due to an insulin overdose as a suicide attempt. He was initially admitted to SDU and placed on dextrose infusion.   Subjective / 24 H Interval events - intermittently violent, refusing treatment - cannot explain to me his behavior;   Assessment/Plan: Principal Problem:   Non compliance w medication regimen Active Problems:   Diabetes mellitus   Insulin overdose   Hypoglycemia   Severe diabetic hypoglycemia   Swelling   Schizophrenia, paranoid   Pressure ulcer   Non-compliance with medication regimen - appreciate psychiatry help with this very difficult patient, increasingly violent, geodon PRN per psychiatry - discussed with Dr. Ozella RocksJonnlagadda 6/3 and again 6/6 - able to transfer to psychiatric facility as soon as bed is available - per psychiatry, patient can be forced for medications/treatments such as insulin, CBGs, depot antipsychotics - SW involved, [facility]. Wait time per SW 18-23 days.  Acute urinary retention - 6/6 pm patient with urinary retention, bladder scan showed > 900 cc, Foley placed - discussed over the phone with Dr. Marlou PorchHerrick today, will need foley for at least 2-3 days, then can be given a voiding trial. Foley placement was very difficult due to his violence, so would hold for a voiding trial for when he is closer to discharge.  - his urinary retention might be related to his antipsychotics  - started Flomax - urinalysis without evidence of infection  IDDM, with renal, neuropathic complications / Insulin Overdose in Setting of Suicidal Ideation - Patient admits to overdose as a suicide attempt, psychiatry consulted, recomended inpatient psych - A1C 10.4 showing poor control - no further hypoglycemic episodes, from  medicine standpoint can be transferred once bed is available  Schizophrenia, paranoid - risperidone im  RLE wounds - edema has decreased  - with history of cellulitis RLE in 2015, no active infection noted  Mild AKI - suspect a degree of CKD in the setting of diabetic nephropathy, stable   Diet: Diet regular Room service appropriate?: Yes; Fluid consistency:: Thin Fluids: none  DVT Prophylaxis: Heparin  Code Status: Full Code Family Communication: None Disposition Plan: psych facility when bed available  Consultants:  Psychiatry  Procedures:  none   Antibiotics  Anti-infectives    None       Studies  No results found.  Objective  Filed Vitals:   11/22/14 0646 11/22/14 1436 11/22/14 1437 11/22/14 1440  BP: 141/82 92/56 156/131 109/60  Pulse: 92 95  93  Temp:  97.7 F (36.5 C)    TempSrc:  Oral    Resp:      Height:      Weight:      SpO2:  99%      Intake/Output Summary (Last 24 hours) at 11/22/14 1642 Last data filed at 11/22/14 1330  Gross per 24 hour  Intake   1425 ml  Output   1050 ml  Net    375 ml   Filed Weights   11/16/14 0030 11/16/14 1755  Weight: 71.3 kg (157 lb 3 oz) 71.623 kg (157 lb 14.4 oz)    Exam: Today deferred to to active psych issues  Data Reviewed: Basic Metabolic Panel:  Recent Labs Lab 11/16/14 0902 11/16/14 1146 11/16/14 1532 11/18/14 0840 11/21/14 1812  NA 135 135 136 140  144  K 4.5 4.4 4.3 4.6 3.5  CL 105 104 104 105 102  CO2 21* 21* 22 20* 27  GLUCOSE 327* 305* 258* 437* 97  BUN CREATININE 1.09 1.25* 1.43* 1.45* 1.42*  CALCIUM 8.5* 8.4* 8.4* 9.0 9.7   Liver Function Tests:  Recent Labs Lab 11/15/14 1953  AST 30  ALT 19  ALKPHOS 69  BILITOT 0.5  PROT 6.2*  ALBUMIN 3.2*   CBC:  Recent Labs Lab 11/15/14 1953 11/18/14 0840  WBC 7.6 7.0  NEUTROABS 5.7  --   HGB 11.4* 10.6*  HCT 35.0* 32.7*  MCV 87.9 90.1  PLT 208 223   CBG:  Recent Labs Lab 11/21/14 2354  11/22/14 0407 11/22/14 0816 11/22/14 1147 11/22/14 1559  GLUCAP 111* 204* 232* 166* 111*    Recent Results (from the past 240 hour(s))  Blood culture (routine x 2)     Status: None   Collection Time: 11/15/14  7:53 PM  Result Value Ref Range Status   Specimen Description BLOOD LEFT FOREARM  Final   Special Requests BOTTLES DRAWN AEROBIC AND ANAEROBIC 5CC EA  Final   Culture   Final    NO GROWTH 5 DAYS Performed at [facility]    Report Status 11/22/2014 FINAL  Final  Blood culture (routine x 2)     Status: None   Collection Time: 11/15/14  8:05 PM  Result Value Ref Range Status   Specimen Description BLOOD LEFT HAND  Final   Special Requests BOTTLES DRAWN AEROBIC AND ANAEROBIC 10CC EA  Final   Culture   Final    NO GROWTH 5 DAYS Performed at [facility]    Report Status 11/22/2014 FINAL  Final  Urine culture     Status: None   Collection Time: 11/15/14  9:47 PM  Result Value Ref Range Status   Specimen Description URINE, CLEAN CATCH  Final   Special Requests NONE  Final   Colony Count   Final    5,000 COLONIES/ML Performed at [facility]    Culture   Final    INSIGNIFICANT GROWTH Performed at [facility]    Report Status 11/17/2014 FINAL  Final  MRSA PCR Screening     Status: None   Collection Time: 11/16/14 12:26 AM  Result Value Ref Range Status   MRSA by PCR NEGATIVE NEGATIVE Final    Comment:        The GeneXpert MRSA Assay (FDA approved for NASAL specimens only), is one component of a comprehensive MRSA colonization surveillance program. It is not intended to diagnose MRSA infection nor to guide or monitor treatment for MRSA infections.      Scheduled Meds: . heparin  5,000 Units Subcutaneous 3 times per day  . insulin aspart  0-9 Units Subcutaneous 6 times per day  . insulin detemir  6 Units Subcutaneous Daily  . pneumococcal 23 valent vaccine  0.5 mL Intramuscular Tomorrow-1000  . risperiDONE  1 mg Oral BID    . risperiDONE microspheres  25 mg Intramuscular Q14 Days  . tamsulosin  0.4 mg Oral QPC supper   Continuous Infusions: . sodium chloride 100 mL/hr at 11/22/14 1615    Pamella Pert, MD [facility] Pager [phone]. If 7 PM - 7 AM, please contact night-coverage at [email or web address], password [facility] 11/22/2014, 4:42 PM  LOS: 7 days

## 2014-11-23 DIAGNOSIS — T383X2D Poisoning by insulin and oral hypoglycemic [antidiabetic] drugs, intentional self-harm, subsequent encounter: Secondary | ICD-10-CM

## 2014-11-23 LAB — GLUCOSE, CAPILLARY
Glucose-Capillary: 115 mg/dL — ABNORMAL HIGH (ref 65–99)
Glucose-Capillary: 150 mg/dL — ABNORMAL HIGH (ref 65–99)
Glucose-Capillary: 179 mg/dL — ABNORMAL HIGH (ref 65–99)
Glucose-Capillary: 212 mg/dL — ABNORMAL HIGH (ref 65–99)
Glucose-Capillary: 72 mg/dL (ref 65–99)
Glucose-Capillary: 95 mg/dL (ref 65–99)

## 2014-11-23 NOTE — Progress Notes (Signed)
PROGRESS NOTE  Dale Hunter ZOX:[id number] DOB: 07/26/1965 DOA: 11/15/2014 PCP: Default, Renato Battlesrovider, MD 49 year old male brought via EMS to ED on 11/15/2014 with an episode of severe diabetic hypoglycemia due to an insulin overdose as a suicide attempt. He was initially admitted to SDU and placed on dextrose infusion.  As the insulin washed out of his system, the patient's CBGs have improved and actually increased. The patient continues to exhibit florid psychosis with paranoid behavior and aggressive behavior to medical staff. Psychiatry was consulted and made recommendations including Risperdal 1 mg twice a day if the patient is cooperative, Geodon 10 mg IM every 12 hours if the patient is agitated and aggressive and refuses medication, and Risperidone Consta 25 mg IM Q14 days due to non compliance with medication.  Risperidone Consta was last given on 11/19/2014  Assessment/Plan: Non-compliance with medication regimen/Suicide attemp - appreciate psychiatry help with this very difficult patient, increasingly violent, geodon PRN per psychiatry - discussed with Dr. Ozella RocksJonnlagadda 6/3 and again 6/6 - able to transfer to psychiatric facility as soon as bed is available - per psychiatry, patient can be forced for medications/treatments such as insulin, CBGs, depot antipsychotics - SW involved, [facility]. Wait time per SW 18-23 days.  Acute urinary retention - 6/6 pm patient with urinary retention, bladder scan showed > 900 cc, Foley placed - discussed over the phone with Dr. Marlou PorchHerrick today, will need foley for at least 2-3 days, then can be given a voiding trial. Foley placement was very difficult due to his violence, so would hold for a voiding trial for when he is closer to discharge.  - his urinary retention might be related to his antipsychotics  - started Flomax - urinalysis without evidence of infection  IDDM, with renal, neuropathic complications / Insulin Overdose  in Setting of Suicidal Ideation - Patient admits to overdose as a suicide attempt, psychiatry consulted, recomended inpatient psych - A1C 10.4 showing poor control - no further hypoglycemic episodes, from medicine standpoint can be transferred once bed is available -Given the patient's aggressive behavior and concern for staff safety, allow for liberal glycemic control  Schizophrenia, paranoid with active psychosis - risperidone IM if continues to refuse po risperdal -Continues to have paranoid delusions and poor insight  RLE wounds - edema has decreased  - with history of cellulitis RLE in 2015, no active infection noted -Patient is afebrile without leukocytosis or hemodynamic instability -No active infection noted on exam  Mild AKI - suspect a degree of CKD in the setting of diabetic nephropathy, stable -Continue IV fluids as the patient has poor po intake   Diet: Diet regular Room service appropriate?: Yes; Fluid consistency:: Thin Fluids: none  DVT Prophylaxis: Heparin  Code Status: Full Code Family Communication: None Disposition Plan: psych facility when bed available         Procedures/Studies: Dg Tibia/fibula Right  11/15/2014   CLINICAL DATA:  Right lower leg pain and swelling.  EXAM: RIGHT TIBIA AND FIBULA - 2 VIEW  COMPARISON:  Prior radiographs and CTs.  FINDINGS: Extensive deformity and degenerative changes of the hindfoot again noted.  There is no evidence of acute fracture.  No new focal bony abnormalities are noted.  Soft tissue swelling is present.  No radiographic evidence of acute osteomyelitis identified.  IMPRESSION: Unchanged appearance of the right ankle with extensive deformity and degenerative changes of the hindfoot.  Soft tissue swelling without definite acute bony  abnormality.   Electronically Signed   By: Harmon Pier M.D.   On: 11/15/2014 23:00   Dg Foot Complete Right  11/15/2014   CLINICAL DATA:  Chronic right foot pain. Current history of  diabetes. Initial encounter.  EXAM: RIGHT FOOT COMPLETE - 3+ VIEW  COMPARISON:  Right foot radiographs performed 08/19/2014  FINDINGS: No definite acute osseous erosions are seen to suggest osteomyelitis.  Charcot joint is noted at the hindfoot, somewhat worsened from the prior study, with loss of visualization of the tibiotalar and subtalar joint. Irregularity along the medial aspect of the base of the first proximal phalanx is stable and likely reflects degenerative change. The midfoot appears grossly intact. An os peroneum is noted.  There is mild diffuse osteopenia of visualized osseous structures. Mild soft tissue swelling is noted about the hindfoot.  IMPRESSION: 1. No acute osseous erosions seen. 2. Charcot joint at the hindfoot, somewhat worsened from the prior study. 3. Mild diffuse osteopenia of visualized osseous structures. 4. Os peroneum noted.   Electronically Signed   By: Roanna Raider M.D.   On: 11/15/2014 22:59         Subjective: Patient continues to be psychotic with paranoid delusions. His speech is nonsensical with flight of ideas. Review of systems is unobtainable secondary to the patient's psychosis.  Objective: Filed Vitals:   11/22/14 1440 11/23/14 0037 11/23/14 0619 11/23/14 1300  BP: 109/60 147/77 167/92 127/67  Pulse: 93 92 93   Temp:  97.7 F (36.5 C) 97.8 F (36.6 C) 97.8 F (36.6 C)  TempSrc:   Oral Oral  Resp:  Height:      Weight:      SpO2:  97% 100%     Intake/Output Summary (Last 24 hours) at 11/23/14 1935 Last data filed at 11/23/14 1855  Gross per 24 hour  Intake 2343.33 ml  Output    800 ml  Net 1543.33 ml   Weight change:  Exam:   General:  Pt is alert, follows commands appropriately, not in acute distress  HEENT: No icterus, No thrush,  [place]/AT  Cardiovascular: RRR, S1/S2, no rubs, no gallops  Respiratory: CTA bilaterally, no wheezing, no crackles, no rhonchi  Abdomen: Soft/+BS, non tender, non distended, no  guarding Extremities: Right ankle edema without any erythema, drainage, crepitance Data Reviewed: Basic Metabolic Panel:  Recent Labs Lab 11/18/14 0840 11/21/14 1812  NA 140 144  K 4.6 3.5  CL 105 102  CO2 20* 27  GLUCOSE 437* 97  BUN 19 18  CREATININE 1.45* 1.42*  CALCIUM 9.0 9.7   Liver Function Tests: No results for input(s): AST, ALT, ALKPHOS, BILITOT, PROT, ALBUMIN in the last 168 hours. No results for input(s): LIPASE, AMYLASE in the last 168 hours. No results for input(s): AMMONIA in the last 168 hours. CBC:  Recent Labs Lab 11/18/14 0840  WBC 7.0  HGB 10.6*  HCT 32.7*  MCV 90.1  PLT 223   Cardiac Enzymes: No results for input(s): CKTOTAL, CKMB, CKMBINDEX, TROPONINI in the last 168 hours. BNP: Invalid input(s): POCBNP CBG:  Recent Labs Lab 11/23/14 0018 11/23/14 0404 11/23/14 0805 11/23/14 1138 11/23/14 1639  GLUCAP 212* 115* 179* 150* 95    Recent Results (from the past 240 hour(s))  Blood culture (routine x 2)     Status: None   Collection Time: 11/15/14  7:53 PM  Result Value Ref Range Status   Specimen Description BLOOD LEFT FOREARM  Final   Special Requests BOTTLES DRAWN  AEROBIC AND ANAEROBIC 5CC EA  Final   Culture   Final    NO GROWTH 5 DAYS Performed at [facility]    Report Status 11/22/2014 FINAL  Final  Blood culture (routine x 2)     Status: None   Collection Time: 11/15/14  8:05 PM  Result Value Ref Range Status   Specimen Description BLOOD LEFT HAND  Final   Special Requests BOTTLES DRAWN AEROBIC AND ANAEROBIC 10CC EA  Final   Culture   Final    NO GROWTH 5 DAYS Performed at [facility]    Report Status 11/22/2014 FINAL  Final  Urine culture     Status: None   Collection Time: 11/15/14  9:47 PM  Result Value Ref Range Status   Specimen Description URINE, CLEAN CATCH  Final   Special Requests NONE  Final   Colony Count   Final    5,000 COLONIES/ML Performed at [facility]    Culture   Final     INSIGNIFICANT GROWTH Performed at [facility]    Report Status 11/17/2014 FINAL  Final  MRSA PCR Screening     Status: None   Collection Time: 11/16/14 12:26 AM  Result Value Ref Range Status   MRSA by PCR NEGATIVE NEGATIVE Final    Comment:        The GeneXpert MRSA Assay (FDA approved for NASAL specimens only), is one component of a comprehensive MRSA colonization surveillance program. It is not intended to diagnose MRSA infection nor to guide or monitor treatment for MRSA infections.   Culture, Urine     Status: None   Collection Time: 11/21/14  6:14 PM  Result Value Ref Range Status   Specimen Description URINE, CATHETERIZED  Final   Special Requests NONE  Final   Colony Count NO GROWTH Performed at [facility]   Final   Culture NO GROWTH Performed at [facility]   Final   Report Status 11/22/2014 FINAL  Final     Scheduled Meds: . heparin  5,000 Units Subcutaneous 3 times per day  . insulin aspart  0-9 Units Subcutaneous 6 times per day  . insulin detemir  6 Units Subcutaneous Daily  . pneumococcal 23 valent vaccine  0.5 mL Intramuscular Tomorrow-1000  . risperiDONE  1 mg Oral BID  . risperiDONE microspheres  25 mg Intramuscular Q14 Days  . tamsulosin  0.4 mg Oral QPC supper   Continuous Infusions: . sodium chloride 100 mL/hr at 11/23/14 1512     Vanette Noguchi, DO  [facility] Pager [phone]  If 7PM-7AM, please contact night-coverage [email or web address] Password [email or web address] 11/23/2014, 7:35 PM   LOS: 8 days

## 2014-11-23 NOTE — Progress Notes (Signed)
Pt refused physical assessment, stated I don't want to have that done now, come back later". Mom at bedside. Sitter present. Will re-eval pt.

## 2014-11-23 NOTE — Care Management Note (Signed)
Case Management Note  Patient Details  Name: Dale Hunter MRN: [id number] Date of Birth: 08/13/1965  Subjective/Objective:    [facility], per psych  CSW, she will find out if patient can have a foley while at the facility. She states she thinks patient can but she will find out for sure.                 Action/Plan:   Expected Discharge Date:                  Expected Discharge Plan:  Psychiatric Hospital  In-House Referral:  Clinical Social Work  Discharge planning Services  CM Consult  Post Acute Care Choice:    Choice offered to:     DME Arranged:    DME Agency:     HH Arranged:    HH Agency:     Status of Service:  Completed, signed off  Medicare Important Message Given:  No Date Medicare IM Given:    Medicare IM give by:    Date Additional Medicare IM Given:    Additional Medicare Important Message give by:     If discussed at Long Length of Stay Meetings, dates discussed:    Additional Comments:  Leone Havenaylor, Tamim Skog [place], RN 11/23/2014, 3:41 PM

## 2014-11-23 NOTE — Clinical Social Work Psych Assess (Signed)
Clinical Social Work Programme researcher, broadcasting/film/videosych Service Line Assessment  Clinical Social Worker:  Melrose NakayamaIngle, Kaydence Baba C, LCSW Date/Time:  11/23/2014, 1:18 PM Referred By:  Clinical Social Work Date Referred:  11/23/14 Reason for Referral:   (paranoid schizophrenia, depression, intentional overdose of insuline)   Presenting Symptoms/Problems Paranoid schizophrenia, depression, delusional, paranoid, intentional insuline overdose  Abuse/Neglect/Trauma History Denies History    Psychiatric History Outpatient Treatment, Inpatient/Hospitalization Patient was set up with [facility]- reportedly (mother) patient was not compliant with meds after discharge  Psychiatric Medication:   Respiradone    Current Mental Health Hospitalizations/Previous Mental Health History:  [facility]   Current Provider:  [facility] - patient was not compliant with follow-up or medications at time of discharge Place and Date:  Ongoing, but non-complaint   Current Medications:   Scheduled Meds: . heparin  5,000 Units Subcutaneous 3 times per day  . insulin aspart  0-9 Units Subcutaneous 6 times per day  . insulin detemir  6 Units Subcutaneous Daily  . pneumococcal 23 valent vaccine  0.5 mL Intramuscular Tomorrow-1000  . risperiDONE  1 mg Oral BID  . risperiDONE microspheres  25 mg Intramuscular Q14 Days  . tamsulosin  0.4 mg Oral QPC supper   Continuous Infusions: . sodium chloride 100 mL/hr at 11/23/14 0243   PRN Meds:.dextrose, ziprasidone  Previous Inpatient Admission/Date/Reason:  [facility] in 2015; no other hospital admissions known   Emotional Health/Current Symptoms  Suicide/Self Harm: Self-Injurious Behaviors (ex. picking & pinching or carving on skin, chronic runaway, poor judgement), Suicide Attempt in the Past (date/description), Suicidal Ideation (ex. "I can't take  anymore, I wish I could disappear"), Has a Plan for Suicide (upon admission; no other known attempts per mother) Suicide Attempt in Past (date/description):  Mother reports patient has no hx of suicide attempts  Other Harmful Behavior (ex. homicidal ideation) (describe):  Patient has delusions which endorses HI and SI.   Psychotic/Dissociative Symptoms Delusional, Paranoia   Attention/Behavioral Symptoms Impulsive, Physical Aggression   Cognitive Impairment Orientation - Self, Orientation - Time, Poor/Impaired Decision-Making, Poor Judgement, Impairment Due to Current Medical Condition/Treatment (stroke, reaction to medication/reaction to infections, etc.) (vascular dementia (per Angelica Chessmanapia, PA-C documentation 11/15/2014))    Mood and Adjustment Paranoia, Aggressive/Frustrated, Unstable/Inconsistent, Unusual/Bizarre, Depression   Stress, Anxiety, Trauma, Any Recent Loss/Stressor Anxiety, Grief/Loss (recent or history) (recent amputation (October 2014))  Other Stress, Anxiety, Trauma, Any Recent Loss/Stressor:  Loss- 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  Patient has Medicaid and receives disability income.  Patient has transportation and access to both mental health and healthcare.   Clinical Social Worker's Interpretive Summary  Clinical Social Workers Interpretive Summary:  Psych CSW spoke with both patient and mother at bedside.  Patient remains delusional.  Per chart review, patient has been diagnosed with vascular dementia as well.  Patient was recently  ad[facility] in March of this year.  [facility] after [facility] overdose of insuline.  Mother reports no delusions prior to amputation in October.  Mother reports patient has had a mental decline since this amputation.  Mother states she has been keeping a journal of patient's delusions, but they seem to cycle back through the same delusions.  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  Mother states that her uncle was dx with paranoid schizophrenia, but no other mental illness present.  Mother feels as if amputation was a trigger for the patient.  Mother can think of no sx prior to the amputation and feels this is a new on-set.  Delusions: 1) father is trying to kill mother, but mother is oblivious 2) someone has put microchips in his fingertips 3) his father is putting drugs in his food to try and kill him 4) father is taking some sort of "super human drug" because of the three of them, he is the only one that has "healthy legs" 5) hospital is poisoning him and is attempting to make it appear to be a suicide attempt 6) the drugs being recommended for the patient are actually to kill him 7) house is "bugged"- patient takes electronics and lights apart attempting to locate the "bug" placed to spy on them.  Patient continues to endorse SI and HI.  Patient states that he will keep his mother safe at all costs even if this includes having to kill his father.  Mother attempted to re-direct patient and reiterate the fact that dad does all he can to assist the family.    Per nursing staff, patient is physically aggressive during the times he is to take medications (likely due to his paranoia).    Patient does not seem to be responding to internal stimuli.  Patient does not endorse AVH, but positive for D.  Mother is aware of the disposition of  [facility] at time of medical discharge.  Mother states she wishes it were closer, but understands our barriers to other inpatient psychiatric hospitalizations.  Mother states that if patient were to stabilize, she would wish to take him back home.      Disposition  Disposition: Inpatient Referral Made [facility], [facility], Geri-Psych)  [facility]

## 2014-11-23 NOTE — Clinical Social Work Psych Note (Addendum)
Psych CSW [facility] clinical[facility] per Connie's request.    Psych CSW [facility] authorization: [id number] valid through 11/29/2014.  Patient is being reviewed for addition [facility].  Of note, mother states if patient were to stabilize, she would be willing to bring him home.  Vickii PennaGina Sol Englert, LCSW [phone]  Psychiatric & Orthopedics (5N 1-8) Clinical Social Worker

## 2014-11-23 NOTE — Progress Notes (Signed)
Pt became very aggressive toward staff when trying to administer medication. Was told by MD that medication needed to be given even if patient refuses and to call security if needed. Security was called in order to administer Levemir and risperadol because patient was very agitated and swinging arms and legs trying to pull away during med administration stating "thats a kill pill, this is murder."  After administration patient began trying to pull out IV and pull off arm band and try to get out of bed. After some talking to he calmed down enough to sit back in bed. Will continue to monitor.

## 2014-11-24 DIAGNOSIS — T383X2S Poisoning by insulin and oral hypoglycemic [antidiabetic] drugs, intentional self-harm, sequela: Secondary | ICD-10-CM

## 2014-11-24 LAB — GLUCOSE, CAPILLARY
GLUCOSE-CAPILLARY: 204 mg/dL — AB (ref 65–99)
GLUCOSE-CAPILLARY: 107 mg/dL — AB (ref 65–99)
Glucose-Capillary: 239 mg/dL — ABNORMAL HIGH (ref 65–99)
Glucose-Capillary: 163 mg/dL — ABNORMAL HIGH (ref 65–99)

## 2014-11-24 NOTE — Clinical Social Work Psych Note (Signed)
Psych CSW received notification from [facility] that the following was needed for clinical review:  -vitals from today -MD progress notes x2 days -glucose readings for today  This information was faxed to [facility] per facility request.  CSW confirmed receipt  Vickii Penna, LCSW [phone]  Psychiatric & Orthopedics (5N 1-8) Clinical Social Worker
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
## 2014-11-24 NOTE — Progress Notes (Signed)
Many attempts to restart IV fluids. Patient's mother at bedside convinced him to let us start IV fluids. Will continue to monitor.

## 2014-11-24 NOTE — Progress Notes (Signed)
PROGRESS NOTE  Dale Hunter LZJ:[id number] DOB: [birth year] DOA: 11/15/2014 PCP: Default, Provider, MD  Brief History 49 year old male brought via EMS to ED on 11/15/2014 with an episode of severe diabetic hypoglycemia due to an insulin overdose as a suicide attempt. He was initially admitted to SDU and placed on dextrose infusion. As the insulin washed out of his system, the patient's CBGs have improved and actually increased. The patient continues to exhibit florid psychosis with paranoid behavior and aggressive behavior to medical staff. Psychiatry was consulted and made recommendations including Risperdal 1 mg twice a day if the patient is cooperative, Geodon 10 mg IM every 12 hours if the patient is agitated and aggressive and refuses medication, and Risperidone Consta 25 mg IM Q14 days due to non compliance with medication. Risperidone Consta was last given on 11/19/2014  Assessment/Plan: Non-compliance with medication regimen/Suicide attemp - appreciate psychiatry help with this very difficult patient, increasingly violent, geodon PRN per psychiatry - discussed with Dr. Ozella Rocks 11/24/14 - able to transfer to psychiatric facility as soon as bed is available - per psychiatry, patient can be forced for medications/treatments such as insulin, CBGs, depot antipsychotics - SW involved, referral to [facility] made. Wait time per SW 18-23 days.  Acute urinary retention - 6/6 pm patient with urinary retention, bladder scan showed > 900 cc, Foley placed - discussed over the phone with Dr. Marlou Porch today, will need foley for at least 2-3 days, then can be given a voiding trial. Foley placement was very difficult due to his violence, so would hold for a voiding trial for when he is closer to discharge.  - his urinary retention might be related to his antipsychotics  - started Flomax - urinalysis without evidence of infection  IDDM, with renal, neuropathic complications / Insulin  Overdose in Setting of Suicidal Ideation - Patient admits to overdose as a suicide attempt, psychiatry consulted, recomended inpatient psych - A1C 10.4 showing poor control - no further hypoglycemic episodes, from medicine standpoint can be transferred once bed is available -Given the patient's aggressive behavior and concern for staff safety, allow for liberal glycemic control  Schizophrenia, paranoid with active psychosis - risperidone IM if continues to refuse po risperdal -Continues to have paranoid delusions and poor insight  RLE wounds - edema has decreased  - with history of cellulitis RLE in 2015, no active infection noted -Patient is afebrile without leukocytosis or hemodynamic instability -No active infection noted on exam  Mild AKI - suspect a degree of CKD in the setting of diabetic nephropathy, stable -Continue IV fluids as the patient has poor po intake -baseline creatinine 0.9-1.2  Diet: Diet regular R Fluid consistency:: Thin  DVT Prophylaxis: Heparin  Code Status: Full Code Family Communication: None Disposition Plan: psych facility when bed available    Procedures/Studies: Dg Tibia/fibula Right  11/15/2014   CLINICAL DATA:  Right lower leg pain and swelling.  EXAM: RIGHT TIBIA AND FIBULA - 2 VIEW  COMPARISON:  Prior radiographs and CTs.  FINDINGS: Extensive deformity and degenerative changes of the hindfoot again noted.  There is no evidence of acute fracture.  No new focal bony abnormalities are noted.  Soft tissue swelling is present.  No radiographic evidence of acute osteomyelitis identified.  IMPRESSION: Unchanged appearance of the right ankle with extensive deformity and degenerative changes of the hindfoot.  Soft tissue swelling without definite acute bony abnormality.   Electronically Signed   By: Tinnie Gens  Hu M.D.   On: 11/15/2014 23:00   Dg Foot Complete Right  11/15/2014   CLINICAL DATA:  Chronic right foot pain. Current history of diabetes. Initial  encounter.  EXAM: RIGHT FOOT COMPLETE - 3+ VIEW  COMPARISON:  Right foot radiographs performed 08/19/2014  FINDINGS: No definite acute osseous erosions are seen to suggest osteomyelitis.  Charcot joint is noted at the hindfoot, somewhat worsened from the prior study, with loss of visualization of the tibiotalar and subtalar joint. Irregularity along the medial aspect of the base of the first proximal phalanx is stable and likely reflects degenerative change. The midfoot appears grossly intact. An os peroneum is noted.  There is mild diffuse osteopenia of visualized osseous structures. Mild soft tissue swelling is noted about the hindfoot.  IMPRESSION: 1. No acute osseous erosions seen. 2. Charcot joint at the hindfoot, somewhat worsened from the prior study. 3. Mild diffuse osteopenia of visualized osseous structures. 4. Os peroneum noted.   Electronically Signed   By: Roanna Raider M.D.   On: 11/15/2014 22:59         Subjective: Patient continues to have delusions and paranoid behavior. He has difficulty verbalizing his concerns. No reports of vomiting, respiratory distress, diarrhea, uncontrolled pain. He denies any chest pain, shortness breath, abdominal pain. He is not eating much.  Objective: Filed Vitals:   11/23/14 0037 11/23/14 0619 11/23/14 1300 11/24/14 1316  BP: 147/77 167/92 127/67 118/96  Pulse: 92 93    Temp: 97.7 F (36.5 C) 97.8 F (36.6 C) 97.8 F (36.6 C) 97.6 F (36.4 C)  TempSrc:  Oral Oral Oral  Resp: Height:      Weight:      SpO2: 97% 100%  100%    Intake/Output Summary (Last 24 hours) at 11/24/14 1759 Last data filed at 11/24/14 1602  Gross per 24 hour  Intake 1143.33 ml  Output   2501 ml  Net -1357.67 ml   Weight change:  Exam:   General:  Pt is alert, not in acute distress  HEENT: No icterus,[place]/AT  Cardiovascular: RRR, S1/S2, no rubs, no gallops  Respiratory: CTA bilaterally, no wheezing, no crackles, no rhonchi  Abdomen:  Soft/+BS, non tender, non distended, no guarding Extremities: Right lower extremity edema without any erythema, crepitance, draining wounds Data Reviewed: Basic Metabolic Panel:  Recent Labs Lab 11/18/14 0840 11/21/14 1812  NA 140 144  K 4.6 3.5  CL 105 102  CO2 20* 27  GLUCOSE 437* 97  BUN 19 18  CREATININE 1.45* 1.42*  CALCIUM 9.0 9.7   Liver Function Tests: No results for input(s): AST, ALT, ALKPHOS, BILITOT, PROT, ALBUMIN in the last 168 hours. No results for input(s): LIPASE, AMYLASE in the last 168 hours. No results for input(s): AMMONIA in the last 168 hours. CBC:  Recent Labs Lab 11/18/14 0840  WBC 7.0  HGB 10.6*  HCT 32.7*  MCV 90.1  PLT 223   Cardiac Enzymes: No results for input(s): CKTOTAL, CKMB, CKMBINDEX, TROPONINI in the last 168 hours. BNP: Invalid input(s): POCBNP CBG:  Recent Labs Lab 11/23/14 1639 11/23/14 2003 11/24/14 0804 11/24/14 1151 11/24/14 1707  GLUCAP 95 72 204* 239* 163*    Recent Results (from the past 240 hour(s))  Blood culture (routine x 2)     Status: None   Collection Time: 11/15/14  7:53 PM  Result Value Ref Range Status   Specimen Description BLOOD LEFT FOREARM  Final   Special Requests BOTTLES DRAWN AEROBIC AND  ANAEROBIC 5CC EA  Final   Culture   Final    NO GROWTH 5 DAYS Performed at [facility]    Report Status 11/22/2014 FINAL  Final  Blood culture (routine x 2)     Status: None   Collection Time: 11/15/14  8:05 PM  Result Value Ref Range Status   Specimen Description BLOOD LEFT HAND  Final   Special Requests BOTTLES DRAWN AEROBIC AND ANAEROBIC 10CC EA  Final   Culture   Final    NO GROWTH 5 DAYS Performed at [facility]    Report Status 11/22/2014 FINAL  Final  Urine culture     Status: None   Collection Time: 11/15/14  9:47 PM  Result Value Ref Range Status   Specimen Description URINE, CLEAN CATCH  Final   Special Requests NONE  Final   Colony Count   Final    5,000  COLONIES/ML Performed at [facility]    Culture   Final    INSIGNIFICANT GROWTH Performed at [facility]    Report Status 11/17/2014 FINAL  Final  MRSA PCR Screening     Status: None   Collection Time: 11/16/14 12:26 AM  Result Value Ref Range Status   MRSA by PCR NEGATIVE NEGATIVE Final    Comment:        The GeneXpert MRSA Assay (FDA approved for NASAL specimens only), is one component of a comprehensive MRSA colonization surveillance program. It is not intended to diagnose MRSA infection nor to guide or monitor treatment for MRSA infections.   Culture, Urine     Status: None   Collection Time: 11/21/14  6:14 PM  Result Value Ref Range Status   Specimen Description URINE, CATHETERIZED  Final   Special Requests NONE  Final   Colony Count NO GROWTH Performed at [facility]   Final   Culture NO GROWTH Performed at [facility]   Final   Report Status 11/22/2014 FINAL  Final     Scheduled Meds: . heparin  5,000 Units Subcutaneous 3 times per day  . insulin aspart  0-9 Units Subcutaneous 6 times per day  . insulin detemir  6 Units Subcutaneous Daily  . pneumococcal 23 valent vaccine  0.5 mL Intramuscular Tomorrow-1000  . risperiDONE  1 mg Oral BID  . risperiDONE microspheres  25 mg Intramuscular Q14 Days  . tamsulosin  0.4 mg Oral QPC supper   Continuous Infusions: . sodium chloride 1,000 mL (11/24/14 1602)     Cydni Reddoch, DO  [facility] Pager [phone]  If 7PM-7AM, please contact night-coverage [email or web address] Password [email or web address] 11/24/2014, 5:59 PM   LOS: 9 days

## 2014-11-25 DIAGNOSIS — T383X1S Poisoning by insulin and oral hypoglycemic [antidiabetic] drugs, accidental (unintentional), sequela: Secondary | ICD-10-CM

## 2014-11-25 LAB — GLUCOSE, CAPILLARY
GLUCOSE-CAPILLARY: 91 mg/dL (ref 65–99)
Glucose-Capillary: 142 mg/dL — ABNORMAL HIGH (ref 65–99)
Glucose-Capillary: 143 mg/dL — ABNORMAL HIGH (ref 65–99)
Glucose-Capillary: 157 mg/dL — ABNORMAL HIGH (ref 65–99)
Glucose-Capillary: 241 mg/dL — ABNORMAL HIGH (ref 65–99)
Glucose-Capillary: 309 mg/dL — ABNORMAL HIGH (ref 65–99)

## 2014-11-25 MED ORDER — RISPERIDONE 2 MG PO TBDP
2.0000 mg | ORAL_TABLET | Freq: Two times a day (BID) | ORAL | Status: DC
  Administered 2014-11-25 – 2014-11-28 (×6): 2 mg via ORAL
  Filled 2014-11-25 (×8): qty 1

## 2014-11-25 NOTE — Clinical Social Work Psych Note (Signed)
Psych CSW was informed by [facility] that patient cannot be admitted to waitlist with a foley.  [facility] requests that prior to adding patient to the waitlist, the patient be foley-free and documentation of independent voiding for at least 24 hours.  This documentation, once complete, will need to be faxed to [facility].  RN updated who is agreeable to reviewing medical necessity with MD.   Psych CSW will continue to follow up and assist with placement.  Current disposition: [facility]) Barriers to discharge to local facilities: wounds, 1-1 ambulations, foley, aggressiveness, non-compliance

## 2014-11-25 NOTE — Progress Notes (Signed)
Pt refused flomax.  Educated pt the importance of taking medication.  Pt stated "I have had better success with lasix.  I would like to try that to help me pee."  Will continue to monitor.

## 2014-11-25 NOTE — Progress Notes (Signed)
Attempted to give insulin and respirdal, but pt refused at this time.  Will try to make another attempt. Foley removed.

## 2014-11-25 NOTE — Progress Notes (Signed)
Checked pt to see if he had voided.  Pt state "I haven't had the urge to go."  Encouraged pt to use the urinal or try to sit on the side of the bed.  Pt attempted, but unsuccessful.  Bladder scanned pt since he had not urinated since 1300.  Showed 375cc.  Informed Dr. Arbutus Leas.  Okay for now with amount, but if becomes more than 750cc, and pt can not void independently, then will evaluate if pt needs foley.

## 2014-11-25 NOTE — Progress Notes (Signed)
PROGRESS NOTE  Kenon Paik EHO:[id number] DOB: 12/14/1965 DOA: 11/15/2014 PCP: Default, Provider, MD  Brief History 49 year old male brought via EMS to ED on 11/15/2014 with an episode of severe diabetic hypoglycemia due to an insulin overdose as a suicide attempt. He was initially admitted to SDU and placed on dextrose infusion. As the insulin washed out of his system, the patient's CBGs have improved and actually increased. The patient continues to exhibit florid psychosis with paranoid behavior and aggressive behavior to medical staff. Psychiatry was consulted and made recommendations including Risperdal 1 mg twice a day if the patient is cooperative, Geodon 10 mg IM every 12 hours if the patient is agitated and aggressive and refuses medication, and Risperidone Consta 25 mg IM Q14 days due to non compliance with medication. Risperidone Consta was last given on 11/19/2014  Assessment/Plan: Non-compliance with medication regimen/Suicide attempt/Paranoid Schizophrenia - appreciate psychiatry help with this very difficult patient, increasingly violent, geodon PRN per psychiatry - discussed with Dr. Elsie Saas 11/24/14 - able to transfer to psychiatric facility as soon as bed is available - per psychiatry, patient can be forced for medications/treatments such as insulin, CBGs, depot antipsychotics - SW involved, referral to [facility] made. Wait time per SW 18-23 days.  Acute urinary retention - 6/6 pm patient with urinary retention, bladder scan showed > 900 cc, Foley placed - discussed over the phone with Dr. Marlou Porch today, will need foley for at least 2-3 days, then can be given a voiding trial. Foley placement was very difficult due to his violence, so would hold for a voiding trial for when he is closer to discharge.  - his urinary retention might be related to his antipsychotics  - Continue Flomax - urinalysis without evidence of infection -11/25/2014--discontinue Foley  catheter for voiding trial  IDDM, with renal, neuropathic complications / Insulin Overdose in Setting of Suicidal Ideation - Patient admits to overdose as a suicide attempt, psychiatry consulted, recomended inpatient psych - A1C 10.4 showing poor control - no further hypoglycemic episodes, from medicine standpoint can be transferred once bed is available -Given the patient's aggressive behavior and concern for staff safety, allow for liberal glycemic control  Schizophrenia, paranoid with active psychosis - risperidone IM if continues to refuse po risperdal -Continues to have paranoid delusions and poor insight -11/25/2014 Patient is more talkative today although he remains paranoid and have delusions; the patient actually ate most of his breakfast and some of his lunch today -11/25/2014 case was discussed with Dr. Glendora Score Risperdal M-tabs to 2 mg bid  RLE wounds - edema has decreased  - with history of cellulitis RLE in 2015, no active infection noted -Patient is afebrile without leukocytosis or hemodynamic instability -No active infection noted on exam  Mild AKI - suspect a degree of CKD in the setting of diabetic nephropathy, stable -baseline creatinine 0.9-1.2 -Continue IV fluids as the patient's oral intake still remains poor;ultimately, this can be discontinued as his oral intake improves - recheck BMP in am Diet: Diet regular R Fluid consistency:: Thin  DVT Prophylaxis: Heparin  Code Status: Full Code Family Communication: None Disposition Plan: psych facility when bed available    Procedures/Studies: Dg Tibia/fibula Right  11/15/2014   CLINICAL DATA:  Right lower leg pain and swelling.  EXAM: RIGHT TIBIA AND FIBULA - 2 VIEW  COMPARISON:  Prior radiographs and CTs.  FINDINGS: Extensive deformity and degenerative changes of the hindfoot again noted.  There is no  evidence of acute fracture.  No new focal bony abnormalities are noted.  Soft tissue swelling is  present.  No radiographic evidence of acute osteomyelitis identified.  IMPRESSION: Unchanged appearance of the right ankle with extensive deformity and degenerative changes of the hindfoot.  Soft tissue swelling without definite acute bony abnormality.   Electronically Signed   By: Harmon Pier M.D.   On: 11/15/2014 23:00   Dg Foot Complete Right  11/15/2014   CLINICAL DATA:  Chronic right foot pain. Current history of diabetes. Initial encounter.  EXAM: RIGHT FOOT COMPLETE - 3+ VIEW  COMPARISON:  Right foot radiographs performed 08/19/2014  FINDINGS: No definite acute osseous erosions are seen to suggest osteomyelitis.  Charcot joint is noted at the hindfoot, somewhat worsened from the prior study, with loss of visualization of the tibiotalar and subtalar joint. Irregularity along the medial aspect of the base of the first proximal phalanx is stable and likely reflects degenerative change. The midfoot appears grossly intact. An os peroneum is noted.  There is mild diffuse osteopenia of visualized osseous structures. Mild soft tissue swelling is noted about the hindfoot.  IMPRESSION: 1. No acute osseous erosions seen. 2. Charcot joint at the hindfoot, somewhat worsened from the prior study. 3. Mild diffuse osteopenia of visualized osseous structures. 4. Os peroneum noted.   Electronically Signed   By: Roanna Raider M.D.   On: 11/15/2014 22:59         Subjective:  patient is more talkative today. He denies any headache, chest pain, shortness breath, vomiting, diarrhea, abdominal pain..   Objective: Filed Vitals:   11/25/14 1610 11/25/14 0558 11/25/14 0611 11/25/14 1450  BP: 148/83 105/43 158/94 156/91  Pulse: 91 96  107  Temp:  98.3 F (36.8 C)    TempSrc:  Oral    Resp:  18    Height:      Weight:      SpO2:  96%  97%    Intake/Output Summary (Last 24 hours) at 11/25/14 1451 Last data filed at 11/25/14 1403  Gross per 24 hour  Intake    400 ml  Output   1051 ml  Net   -651 ml    Weight change:  Exam:   General:  Pt is alert, follows commands appropriately, not in acute distress   Cardiovascular: RRR, S1/S2, no rubs, no gallops  Respiratory: CTA bilaterally, no wheezing, no crackles, no rhonchi  Extremities: right lower extremity edema without any erythema, draining wounds  Data Reviewed: Basic Metabolic Panel:  Recent Labs Lab 11/21/14 1812  NA 144  K 3.5  CL 102  CO2 27  GLUCOSE 97  BUN 18  CREATININE 1.42*  CALCIUM 9.7   Liver Function Tests: No results for input(s): AST, ALT, ALKPHOS, BILITOT, PROT, ALBUMIN in the last 168 hours. No results for input(s): LIPASE, AMYLASE in the last 168 hours. No results for input(s): AMMONIA in the last 168 hours. CBC: No results for input(s): WBC, NEUTROABS, HGB, HCT, MCV, PLT in the last 168 hours. Cardiac Enzymes: No results for input(s): CKTOTAL, CKMB, CKMBINDEX, TROPONINI in the last 168 hours. BNP: Invalid input(s): POCBNP CBG:  Recent Labs Lab 11/24/14 2006 11/25/14 0012 11/25/14 0453 11/25/14 0805 11/25/14 1228  GLUCAP 107* 91 142* 143* 157*    Recent Results (from the past 240 hour(s))  Blood culture (routine x 2)     Status: None   Collection Time: 11/15/14  7:53 PM  Result Value Ref Range Status   Specimen Description  BLOOD LEFT FOREARM  Final   Special Requests BOTTLES DRAWN AEROBIC AND ANAEROBIC 5CC EA  Final   Culture   Final    NO GROWTH 5 DAYS Performed at [facility]    Report Status 11/22/2014 FINAL  Final  Blood culture (routine x 2)     Status: None   Collection Time: 11/15/14  8:05 PM  Result Value Ref Range Status   Specimen Description BLOOD LEFT HAND  Final   Special Requests BOTTLES DRAWN AEROBIC AND ANAEROBIC 10CC EA  Final   Culture   Final    NO GROWTH 5 DAYS Performed at [facility]    Report Status 11/22/2014 FINAL  Final  Urine culture     Status: None   Collection Time: 11/15/14  9:47 PM  Result Value Ref Range Status   Specimen  Description URINE, CLEAN CATCH  Final   Special Requests NONE  Final   Colony Count   Final    5,000 COLONIES/ML Performed at [facility]    Culture   Final    INSIGNIFICANT GROWTH Performed at [facility]    Report Status 11/17/2014 FINAL  Final  MRSA PCR Screening     Status: None   Collection Time: 11/16/14 12:26 AM  Result Value Ref Range Status   MRSA by PCR NEGATIVE NEGATIVE Final    Comment:        The GeneXpert MRSA Assay (FDA approved for NASAL specimens only), is one component of a comprehensive MRSA colonization surveillance program. It is not intended to diagnose MRSA infection nor to guide or monitor treatment for MRSA infections.   Culture, Urine     Status: None   Collection Time: 11/21/14  6:14 PM  Result Value Ref Range Status   Specimen Description URINE, CATHETERIZED  Final   Special Requests NONE  Final   Colony Count NO GROWTH Performed at [facility]   Final   Culture NO GROWTH Performed at [facility]   Final   Report Status 11/22/2014 FINAL  Final     Scheduled Meds: . heparin  5,000 Units Subcutaneous 3 times per day  . insulin aspart  0-9 Units Subcutaneous 6 times per day  . insulin detemir  6 Units Subcutaneous Daily  . pneumococcal 23 valent vaccine  0.5 mL Intramuscular Tomorrow-1000  . risperiDONE  2 mg Oral BID  . risperiDONE microspheres  25 mg Intramuscular Q14 Days  . tamsulosin  0.4 mg Oral QPC supper   Continuous Infusions: . sodium chloride 100 mL/hr at 11/25/14 1148     Annalynn Centanni, DO  [facility] Pager [phone]  If 7PM-7AM, please contact night-coverage [email or web address] Password [email or web address] 11/25/2014, 2:51 PM   LOS: 10 days

## 2014-11-26 LAB — BASIC METABOLIC PANEL
Anion gap: 14 (ref 5–15)
BUN: 6 mg/dL (ref 6–20)
CO2: 22 mmol/L (ref 22–32)
CREATININE: 0.9 mg/dL (ref 0.61–1.24)
Calcium: 8.5 mg/dL — ABNORMAL LOW (ref 8.9–10.3)
Chloride: 105 mmol/L (ref 101–111)
GFR calc Af Amer: >60 mL/min (ref >60)
GFR calc non Af Amer: >60 mL/min (ref >60)
Glucose, Bld: 112 mg/dL — ABNORMAL HIGH (ref 65–99)
Potassium: 3.4 mmol/L — ABNORMAL LOW (ref 3.5–5.1)
Sodium: 141 mmol/L (ref 135–145)

## 2014-11-26 LAB — GLUCOSE, CAPILLARY
GLUCOSE-CAPILLARY: 140 mg/dL — AB (ref 65–99)
GLUCOSE-CAPILLARY: 121 mg/dL — AB (ref 65–99)
GLUCOSE-CAPILLARY: 140 mg/dL — AB (ref 65–99)
Glucose-Capillary: 117 mg/dL — ABNORMAL HIGH (ref 65–99)
Glucose-Capillary: 137 mg/dL — ABNORMAL HIGH (ref 65–99)
Glucose-Capillary: 152 mg/dL — ABNORMAL HIGH (ref 65–99)
Glucose-Capillary: 160 mg/dL — ABNORMAL HIGH (ref 65–99)
Glucose-Capillary: 56 mg/dL — ABNORMAL LOW (ref 65–99)
Glucose-Capillary: 59 mg/dL — ABNORMAL LOW (ref 65–99)

## 2014-11-26 MED ORDER — SODIUM CHLORIDE 0.9 % IV SOLN
INTRAVENOUS | Status: DC
  Administered 2014-11-26 – 2014-11-29 (×6): via INTRAVENOUS
  Filled 2014-11-26 (×11): qty 1000

## 2014-11-26 MED ORDER — POTASSIUM CHLORIDE CRYS ER 10 MEQ PO TBCR
10.0000 meq | EXTENDED_RELEASE_TABLET | Freq: Once | ORAL | Status: DC
Start: 2014-11-26 — End: 2014-11-30
  Filled 2014-11-26: qty 1

## 2014-11-26 MED ORDER — DEXTROSE 50 % IV SOLN
INTRAVENOUS | Status: AC
  Filled 2014-11-26: qty 50

## 2014-11-26 NOTE — Progress Notes (Signed)
PROGRESS NOTE  Dale Hunter VCB:[id number] DOB: 11/24/1965 DOA: 11/15/2014 PCP: Default, Provider, MD  Brief History 49 year old male brought via EMS to ED on 11/15/2014 with an episode of severe diabetic hypoglycemia due to an insulin overdose as a suicide attempt. He was initially admitted to SDU and placed on dextrose infusion. As the insulin washed out of his system, the patient's CBGs have improved and actually increased. The patient continues to exhibit florid psychosis with paranoid behavior and aggressive behavior to medical staff. Psychiatry was consulted and made recommendations including Risperdal 1 mg twice a day if the patient is cooperative, Geodon 10 mg IM every 12 hours if the patient is agitated and aggressive and refuses medication, and Risperidone Consta 25 mg IM Q14 days due to non compliance with medication. Risperidone Consta was last given on 11/19/2014. Patient had initial problems with urinary retention. After his Foley catheter was removed on 11/25/2014, the patient was able to void multiple times spontaneously without difficulty. Assessment/Plan: Non-compliance with medication regimen/Suicide attempt/Paranoid Schizophrenia - appreciate psychiatry help with this very difficult patient, increasingly violent, geodon PRN per psychiatry - discussed with Dr. Elsie Saas 11/24/14 - able to transfer to psychiatric facility as soon as bed is available - per psychiatry, patient can be forced for medications/treatments such as insulin, CBGs, depot antipsychotics - SW involved, referral to [facility] made. Wait time per SW 18-23 days. -11/26/14--pt is medically stable for transfer  Acute urinary retention-resolved - 6/6 pm patient with urinary retention, bladder scan showed > 900 cc, Foley placed - Continue Flomax - urinalysis without evidence of infection -11/25/2014--discontinue Foley catheter for voiding trial -11/26/14--pt able to void spontaneously on numerous  occasions  IDDM, with renal, neuropathic complications / Insulin Overdose in Setting of Suicidal Ideation - Patient admits to overdose as a suicide attempt, psychiatry consulted, recomended inpatient psych - A1C 10.4 showing poor control - no further hypoglycemic episodes, from medicine standpoint can be transferred once bed is available -Given the patient's aggressive behavior and concern for staff safety, allow for liberal glycemic control  Schizophrenia, paranoid with active psychosis - risperidone IM if continues to refuse po risperdal -Continues to have paranoid delusions and poor insight -11/25/2014 Patient is more talkative today although he remains paranoid and have delusions; the patient actually ate most of his breakfast and some of his lunch today -11/25/2014 case was discussed with Dr. Glendora Score Risperdal M-tabs to 2 mg bid -11/26/14--patient is medically stable for transfer to psychiatric facility;  he is eating better in the past 48 hours   RLE wounds - edema has decreased  - with history of cellulitis RLE in 2015, no active infection noted -Patient is afebrile without leukocytosis or hemodynamic instability -No active infection noted on exam  Mild AKI-resolved - suspect a degree of CKD in the setting of diabetic nephropathy, stable -baseline creatinine 0.9-1.2 -Continue IV fluids as the patient's oral intake still remains poor;ultimately, this can be discontinued as his oral intake improves - 11/26/2014--serum creatinine 0.90   hypokalemia -Replete -Add potassium to maintenance fluids  Diet: Diet regular R Fluid consistency:: Thin  DVT Prophylaxis: Heparin  Code Status: Full Code Family Communication: None Disposition Plan: psych facility when bed available      Procedures/Studies: Dg Tibia/fibula Right  11/15/2014   CLINICAL DATA:  Right lower leg pain and swelling.  EXAM: RIGHT TIBIA AND FIBULA - 2 VIEW  COMPARISON:  Prior radiographs and  CTs.  FINDINGS: Extensive deformity  and degenerative changes of the hindfoot again noted.  There is no evidence of acute fracture.  No new focal bony abnormalities are noted.  Soft tissue swelling is present.  No radiographic evidence of acute osteomyelitis identified.  IMPRESSION: Unchanged appearance of the right ankle with extensive deformity and degenerative changes of the hindfoot.  Soft tissue swelling without definite acute bony abnormality.   Electronically Signed   By: Harmon Pier M.D.   On: 11/15/2014 23:00   Dg Foot Complete Right  11/15/2014   CLINICAL DATA:  Chronic right foot pain. Current history of diabetes. Initial encounter.  EXAM: RIGHT FOOT COMPLETE - 3+ VIEW  COMPARISON:  Right foot radiographs performed 08/19/2014  FINDINGS: No definite acute osseous erosions are seen to suggest osteomyelitis.  Charcot joint is noted at the hindfoot, somewhat worsened from the prior study, with loss of visualization of the tibiotalar and subtalar joint. Irregularity along the medial aspect of the base of the first proximal phalanx is stable and likely reflects degenerative change. The midfoot appears grossly intact. An os peroneum is noted.  There is mild diffuse osteopenia of visualized osseous structures. Mild soft tissue swelling is noted about the hindfoot.  IMPRESSION: 1. No acute osseous erosions seen. 2. Charcot joint at the hindfoot, somewhat worsened from the prior study. 3. Mild diffuse osteopenia of visualized osseous structures. 4. Os peroneum noted.   Electronically Signed   By: Roanna Raider M.D.   On: 11/15/2014 22:59         Subjective:  more talkative today. Denies any chest pain, shortness breath, headache, abdominal pain, dysuria.   Objective: Filed Vitals:   11/25/14 1450 11/25/14 2231 11/26/14 0500 11/26/14 1549  BP: 156/91 159/90 143/79 164/95  Pulse: 107 100 98 109  Temp:  97.6 F (36.4 C) 98.6 F (37 C) 98.5 F (36.9 C)  TempSrc:  Oral  Oral  Resp:  Height:      Weight:      SpO2: 97% 100% 100% 100%    Intake/Output Summary (Last 24 hours) at 11/26/14 1840 Last data filed at 11/26/14 1804  Gross per 24 hour  Intake    120 ml  Output   1625 ml  Net  -1505 ml   Weight change:  Exam:   General:  Pt is alert, follows commands appropriately, not in acute distress  HEENT: No icterus, No thrush,  [place]/AT  Cardiovascular: RRR, S1/S2, no rubs, no gallops  Respiratory: CTA bilaterally, no wheezing, no crackles, no rhonchi  Abdomen: Soft/+BS, non tender, non distended, no guarding Extremities: right lower extremity without any erythema, drainage, crepitance.  Data Reviewed: Basic Metabolic Panel:  Recent Labs Lab 11/21/14 1812 11/26/14 0553  NA 144 141  K 3.5 3.4*  CL 102 105  CO2 27 22  GLUCOSE 97 112*  BUN 18 6  CREATININE 1.42* 0.90  CALCIUM 9.7 8.5*   Liver Function Tests: No results for input(s): AST, ALT, ALKPHOS, BILITOT, PROT, ALBUMIN in the last 168 hours. No results for input(s): LIPASE, AMYLASE in the last 168 hours. No results for input(s): AMMONIA in the last 168 hours. CBC: No results for input(s): WBC, NEUTROABS, HGB, HCT, MCV, PLT in the last 168 hours. Cardiac Enzymes: No results for input(s): CKTOTAL, CKMB, CKMBINDEX, TROPONINI in the last 168 hours. BNP: Invalid input(s): POCBNP CBG:  Recent Labs Lab 11/26/14 0501 11/26/14 0626 11/26/14 0801 11/26/14 1125 11/26/14 1702  GLUCAP 140* 121* 137* 160* 140*  Recent Results (from the past 240 hour(s))  Culture, Urine     Status: None   Collection Time: 11/21/14  6:14 PM  Result Value Ref Range Status   Specimen Description URINE, CATHETERIZED  Final   Special Requests NONE  Final   Colony Count NO GROWTH Performed at [facility]   Final   Culture NO GROWTH Performed at [facility]   Final   Report Status 11/22/2014 FINAL  Final     Scheduled Meds: . heparin  5,000 Units Subcutaneous 3 times per day  . insulin  aspart  0-9 Units Subcutaneous 6 times per day  . insulin detemir  6 Units Subcutaneous Daily  . pneumococcal 23 valent vaccine  0.5 mL Intramuscular Tomorrow-1000  . risperiDONE  2 mg Oral BID  . risperiDONE microspheres  25 mg Intramuscular Q14 Days  . tamsulosin  0.4 mg Oral QPC supper   Continuous Infusions: . sodium chloride 100 mL/hr at 11/26/14 1701     Lamiah Marmol, DO  [facility] Pager [phone]  If 7PM-7AM, please contact night-coverage [email or web address] Password [email or web address] 11/26/2014, 6:40 PM   LOS: 11 days

## 2014-11-26 NOTE — BH Assessment (Signed)
Sent copy of labs to [facility] as requested. ECK and IVC are still needed.    Clista Bernhardt, [facility] Triage Specialist 11/26/2014 9:38 PM

## 2014-11-26 NOTE — Progress Notes (Addendum)
Disposition CSW contacted [facility] admissions, patient not on the wait list.  CSW will complete referral to [facility] wait list and fax patient out to available inpatient placements.  [facility] Authorization # [id number]  Seward Speck [facility] Disposition CSW [phone]

## 2014-11-26 NOTE — Progress Notes (Signed)
Pt lower abdomen visibly distended and patient had not urinated since day shift. Bladder scan at 2357 showed . On-call clinician paged and in and out cath ordered x1. Pt was hesitant and after much convincing agreed. Pt had an incontinent episode but bladder still distended. In and out cath pulled of fluid.

## 2014-11-26 NOTE — Progress Notes (Signed)
Pt blood sugar 59mg /dl but pt uncooperative and refused to eat or drink anything. PRN IV dextrose 25gm/45ml ordered. Per nursing judgement, only 12.5gm was given since hypoglycemia was mild. Blood sugar rechecked and was 140mg /dl. On-call clinician was notified of events and interventions. Will continue to monitor patient.

## 2014-11-26 NOTE — BHH Counselor (Addendum)
TC from Sport and exercise psychologist at [facility]. She requests patient's IVC paperwork including Exam & Recommendation. She also needs the EKG and updated labs. Jane requests complete metabolic panel, CBC & UA. Writer notified oncoming TTS shift.   Evette Cristal, [place] [facility] Triage Specialist

## 2014-11-27 LAB — CBC
HEMATOCRIT: 34.1 % — AB (ref 39.0–52.0)
Hemoglobin: 11.1 g/dL — ABNORMAL LOW (ref 13.0–17.0)
MCH: 28.8 pg (ref 26.0–34.0)
MCHC: 32.6 g/dL (ref 30.0–36.0)
MCV: 88.6 fL (ref 78.0–100.0)
Platelets: 244 10*3/uL (ref 150–400)
RBC: 3.85 MIL/uL — ABNORMAL LOW (ref 4.22–5.81)
RDW: 13.9 % (ref 11.5–15.5)
WBC: 7.7 10*3/uL (ref 4.0–10.5)

## 2014-11-27 LAB — GLUCOSE, CAPILLARY
GLUCOSE-CAPILLARY: 157 mg/dL — AB (ref 65–99)
GLUCOSE-CAPILLARY: 190 mg/dL — AB (ref 65–99)
GLUCOSE-CAPILLARY: 234 mg/dL — AB (ref 65–99)
GLUCOSE-CAPILLARY: 284 mg/dL — AB (ref 65–99)
Glucose-Capillary: 186 mg/dL — ABNORMAL HIGH (ref 65–99)
Glucose-Capillary: 183 mg/dL — ABNORMAL HIGH (ref 65–99)

## 2014-11-27 MED ORDER — ACETAMINOPHEN 325 MG PO TABS
650.0000 mg | ORAL_TABLET | Freq: Four times a day (QID) | ORAL | Status: DC | PRN
  Administered 2014-11-28: 650 mg via ORAL
  Filled 2014-11-27 (×2): qty 2

## 2014-11-27 NOTE — Progress Notes (Signed)
Pt refused scheduled flomax and PO potassium chloride. Will try again, and continue to monitor.

## 2014-11-27 NOTE — Progress Notes (Signed)
PROGRESS NOTE  Dale Hunter BRA:[id number] DOB: 12-29-1965 DOA: 11/15/2014 PCP: Default, Provider, MD   Brief History 49 year old male brought via EMS to ED on 11/15/2014 with an episode of severe diabetic hypoglycemia due to an insulin overdose as a suicide attempt. He was initially admitted to SDU and placed on dextrose infusion. As the insulin washed out of his system, the patient's CBGs have improved and actually increased. The patient continues to exhibit florid psychosis with paranoid behavior and aggressive behavior to medical staff. Psychiatry was consulted and made recommendations including Risperdal 1 mg twice a day if the patient is cooperative, Geodon 10 mg IM every 12 hours if the patient is agitated and aggressive and refuses medication, and Risperidone Consta 25 mg IM Q14 days due to non compliance with medication. Risperidone Consta was last given on 11/19/2014. Patient had initial problems with urinary retention. After his Foley catheter was removed on 11/25/2014, the patient was able to void multiple times spontaneously without difficulty. Assessment/Plan: Non-compliance with medication regimen/Suicide attempt/Paranoid Schizophrenia - appreciate psychiatry help with this very difficult patient, increasingly violent, geodon PRN per psychiatry - discussed with Dr. Elsie Saas 11/24/14 - able to transfer to psychiatric facility as soon as bed is available - per psychiatry, patient can be forced for medications/treatments such as insulin, CBGs, depot antipsychotics - SW involved, referral to [facility] made. Wait time per SW 18-23 days. -11/26/14--pt is medically stable for transfer  Acute urinary retention-resolved - 6/6 pm patient with urinary retention, bladder scan showed > 900 cc, Foley placed - Continue Flomax - urinalysis without evidence of infection -11/25/2014--discontinue Foley catheter for voiding trial -11/26/14--pt able to void spontaneously on  numerous occasions  IDDM, with renal, neuropathic complications / Insulin Overdose in Setting of Suicidal Ideation - Patient admits to overdose as a suicide attempt, psychiatry consulted, recomended inpatient psych - A1C 10.4 showing poor control - no further hypoglycemic episodes, from medicine standpoint can be transferred once bed is available -Given the patient's aggressive behavior and concern for staff safety, allow for liberal glycemic control -CBGs starting to rise as pt now starting to eat more  Schizophrenia, paranoid with active psychosis - risperidone IM if continues to refuse po risperdal -Continues to have paranoid delusions and poor insight -11/25/2014 Patient is more talkative today although he remains paranoid and have delusions; the patient actually ate most of his breakfast and some of his lunch today -11/25/2014 case was discussed with Dr. Glendora Score Risperdal M-tabs to 2 mg bid -11/26/14--patient is medically stable for transfer to psychiatric facility; he is eating better in the past 48 hours; a little more talkative  RLE wounds - edema has decreased  - with history of cellulitis RLE in 2015, no active infection noted -Patient is afebrile without leukocytosis or hemodynamic instability -No active infection noted on exam -WBC = 7.7 with stable Hgb  Mild AKI-resolved - suspect a degree of CKD in the setting of diabetic nephropathy, stable -baseline creatinine 0.9-1.2 -Continue IV fluids as the patient's oral intake still remains poor;ultimately, this can be discontinued as his oral intake improves - 11/26/2014--serum creatinine 0.90   Hypokalemia -Repleted -Added potassium to maintenance fluids -plan to wean maintenance fluids as pt continues to eat bettter  Diet: Diet regular R Fluid consistency:: Thin  DVT Prophylaxis: Heparin  Code Status: Full Code Family Communication: None Disposition Plan: psych facility when bed  available  Procedures/Studies: Dg Tibia/fibula Right  11/15/2014   CLINICAL DATA:  Right lower leg pain and swelling.  EXAM: RIGHT TIBIA AND FIBULA - 2 VIEW  COMPARISON:  Prior radiographs and CTs.  FINDINGS: Extensive deformity and degenerative changes of the hindfoot again noted.  There is no evidence of acute fracture.  No new focal bony abnormalities are noted.  Soft tissue swelling is present.  No radiographic evidence of acute osteomyelitis identified.  IMPRESSION: Unchanged appearance of the right ankle with extensive deformity and degenerative changes of the hindfoot.  Soft tissue swelling without definite acute bony abnormality.   Electronically Signed   By: Harmon Pier M.D.   On: 11/15/2014 23:00   Dg Foot Complete Right  11/15/2014   CLINICAL DATA:  Chronic right foot pain. Current history of diabetes. Initial encounter.  EXAM: RIGHT FOOT COMPLETE - 3+ VIEW  COMPARISON:  Right foot radiographs performed 08/19/2014  FINDINGS: No definite acute osseous erosions are seen to suggest osteomyelitis.  Charcot joint is noted at the hindfoot, somewhat worsened from the prior study, with loss of visualization of the tibiotalar and subtalar joint. Irregularity along the medial aspect of the base of the first proximal phalanx is stable and likely reflects degenerative change. The midfoot appears grossly intact. An os peroneum is noted.  There is mild diffuse osteopenia of visualized osseous structures. Mild soft tissue swelling is noted about the hindfoot.  IMPRESSION: 1. No acute osseous erosions seen. 2. Charcot joint at the hindfoot, somewhat worsened from the prior study. 3. Mild diffuse osteopenia of visualized osseous structures. 4. Os peroneum noted.   Electronically Signed   By: Roanna Raider M.D.   On: 11/15/2014 22:59         Subjective: Patient more talkative to staff today. Denies any fevers, chills, chest pain, abdominal pain, dysuria, vomiting.  Objective: Filed Vitals:   11/27/14  0431 11/27/14 0851 11/27/14 0858 11/27/14 1351  BP: 152/86  138/68 142/69  Pulse: 88  97 115  Temp: 98.1 F (36.7 C) 99.3 F (37.4 C) 99.3 F (37.4 C)   TempSrc: Oral Oral Oral   Resp: 18   16  Height:      Weight:      SpO2: 98%  92% 96%    Intake/Output Summary (Last 24 hours) at 11/27/14 1643 Last data filed at 11/27/14 1405  Gross per 24 hour  Intake   1120 ml  Output    975 ml  Net    145 ml   Weight change:  Exam:  PATIENT REFUSED PHYSICAL EXAM TODAY Data Reviewed: Basic Metabolic Panel:  Recent Labs Lab 11/21/14 1812 11/26/14 0553  NA 144 141  K 3.5 3.4*  CL 102 105  CO2 27 22  GLUCOSE 97 112*  BUN 18 6  CREATININE 1.42* 0.90  CALCIUM 9.7 8.5*   Liver Function Tests: No results for input(s): AST, ALT, ALKPHOS, BILITOT, PROT, ALBUMIN in the last 168 hours. No results for input(s): LIPASE, AMYLASE in the last 168 hours. No results for input(s): AMMONIA in the last 168 hours. CBC:  Recent Labs Lab 11/27/14 1442  WBC 7.7  HGB 11.1*  HCT 34.1*  MCV 88.6  PLT 244   Cardiac Enzymes: No results for input(s): CKTOTAL, CKMB, CKMBINDEX, TROPONINI in the last 168 hours. BNP: Invalid input(s): POCBNP CBG:  Recent Labs Lab 11/26/14 2012 11/26/14 2358 11/27/14 0402 11/27/14 0843 11/27/14 1228  GLUCAP 152* 234* 186* 183* 157*    Recent Results (from the past 240 hour(s))  Culture, Urine     Status: None  Collection Time: 11/21/14  6:14 PM  Result Value Ref Range Status   Specimen Description URINE, CATHETERIZED  Final   Special Requests NONE  Final   Colony Count NO GROWTH Performed at [facility]   Final   Culture NO GROWTH Performed at [facility]   Final   Report Status 11/22/2014 FINAL  Final     Scheduled Meds: . heparin  5,000 Units Subcutaneous 3 times per day  . insulin aspart  0-9 Units Subcutaneous 6 times per day  . insulin detemir  6 Units Subcutaneous Daily  . pneumococcal 23 valent vaccine  0.5 mL  Intramuscular Tomorrow-1000  . potassium chloride  10 mEq Oral Once  . risperiDONE  2 mg Oral BID  . risperiDONE microspheres  25 mg Intramuscular Q14 Days  . tamsulosin  0.4 mg Oral QPC supper   Continuous Infusions: . sodium chloride 0.9 % 1,000 mL with potassium chloride 20 mEq infusion 100 mL/hr at 11/27/14 0946     Dale Zeiders, DO  [facility] Pager [phone]  If 7PM-7AM, please contact night-coverage [email or web address] Password [facility] 11/27/2014, 4:43 PM   LOS: 12 days

## 2014-11-28 DIAGNOSIS — E876 Hypokalemia: Secondary | ICD-10-CM

## 2014-11-28 LAB — GLUCOSE, CAPILLARY
GLUCOSE-CAPILLARY: 141 mg/dL — AB (ref 65–99)
GLUCOSE-CAPILLARY: 239 mg/dL — AB (ref 65–99)
GLUCOSE-CAPILLARY: 80 mg/dL (ref 65–99)
Glucose-Capillary: 161 mg/dL — ABNORMAL HIGH (ref 65–99)
Glucose-Capillary: 175 mg/dL — ABNORMAL HIGH (ref 65–99)
Glucose-Capillary: 122 mg/dL — ABNORMAL HIGH (ref 65–99)

## 2014-11-28 MED ORDER — RISPERIDONE 1 MG PO TBDP
1.0000 mg | ORAL_TABLET | Freq: Every day | ORAL | Status: DC
  Administered 2014-11-29: 1 mg via ORAL
  Filled 2014-11-28: qty 1

## 2014-11-28 MED ORDER — BENZTROPINE MESYLATE 1 MG PO TABS
1.0000 mg | ORAL_TABLET | Freq: Two times a day (BID) | ORAL | Status: DC
  Administered 2014-11-28 – 2014-11-29 (×2): 1 mg via ORAL
  Filled 2014-11-28 (×3): qty 1

## 2014-11-28 MED ORDER — RISPERIDONE 2 MG PO TBDP
2.0000 mg | ORAL_TABLET | Freq: Every day | ORAL | Status: DC
  Administered 2014-11-28: 2 mg via ORAL
  Filled 2014-11-28 (×2): qty 1

## 2014-11-28 NOTE — Progress Notes (Signed)
Inpatient Diabetes Program Recommendations  AACE/ADA: New Consensus Statement on Inpatient Glycemic Control (2013)  Target Ranges:  Prepandial:   less than 140 mg/dL      Peak postprandial:   less than 180 mg/dL (1-2 hours)      Critically ill patients:  140 - 180 mg/dL   Results for Dale Hunter, Dale Hunter (MRN [id number]) as of 11/28/2014 10:23  Ref. Range 11/27/2014 04:02 11/27/2014 08:43 11/27/2014 12:28 11/27/2014 16:50 11/27/2014 20:24 11/28/2014 00:20 11/28/2014 04:03 11/28/2014 08:00  Glucose-Capillary Latest Ref Range: 65-99 mg/dL 505 (H) 397 (H) 673 (H) 190 (H) 284 (H) 239 (H) 161 (H) 80   Diabetes history: DM1 Current orders for Inpatient glycemic control: Levemir 6 units daily, Novolog 0-9 units Q4H  Inpatient Diabetes Program Recommendations Insulin - Meal Coverage: If patient is consistently eating at least 50% of meals, please consider ordering Novolog 4 units TID with meals for meal coverage (in addition to Novolog correction).  Thanks, Orlando Penner, RN, MSN, CCRN, CDE Diabetes Coordinator Inpatient Diabetes Program [phone] (Team Pager from 8am to 5pm) [phone] ([facility] office) [phone] [facility] office) [phone] [facility] office)

## 2014-11-28 NOTE — Clinical Social Work Psych Note (Addendum)
12:40pm- Psych CSW received notification that [facility] is requesting INOs documented for patient prior to review.  RN made aware.  12:33pm Per RN, patient's foley has been dc'd.  Psych CSW has completed [facility] application and sent clinicals for a second review.  (Patient declined prior due to foley). Patient currently under review.    Vickii Penna, LCSW [phone]  Psychiatric & Orthopedics (5N 1-8) Clinical Social Worker

## 2014-11-28 NOTE — Progress Notes (Signed)
No bed available at [facility], [place].

## 2014-11-28 NOTE — Progress Notes (Signed)
PROGRESS NOTE  Dale Hunter ZOX:[id number] DOB: 10-14-1965 DOA: 11/15/2014 PCP: Default, Provider, MD  Brief History 49 year old male brought via EMS to ED on 11/15/2014 with an episode of severe diabetic hypoglycemia due to an insulin overdose as a suicide attempt. He was initially admitted to SDU and placed on dextrose infusion. As the insulin washed out of his system, the patient's CBGs have improved and actually increased. The patient continues to exhibit florid psychosis with paranoid behavior and aggressive behavior to medical staff. Psychiatry was consulted and made recommendations including Risperdal 1 mg twice a day if the patient is cooperative, Geodon 10 mg IM every 12 hours if the patient is agitated and aggressive and refuses medication, and Risperidone Consta 25 mg IM Q14 days due to non compliance with medication. Risperidone Consta was last given on 11/19/2014. Patient had initial problems with urinary retention. After his Foley catheter was removed on 11/25/2014, the patient was able to void multiple times spontaneously without difficulty. Assessment/Plan: Non-compliance with medication regimen/Suicide attempt/Paranoid Schizophrenia - appreciate psychiatry help with this very difficult patient, increasingly violent, geodon PRN per psychiatry - discussed with Dr. Elsie Saas 11/28/14 - able to transfer to psychiatric facility as soon as bed is available - per psychiatry, patient can be forced for medications/treatments such as insulin, CBGs, depot antipsychotics - SW involved, referral to [facility] made. Wait time per SW 18-23 days. -11/26/14--pt is medically stable for transfer  Acute urinary retention-resolved - 6/6 pm patient with urinary retention, bladder scan showed > 900 cc, Foley placed - Continue Flomax - urinalysis without evidence of infection -11/25/2014--discontinue Foley catheter for voiding trial -11/26/14--pt able to void spontaneously on numerous  occasions  IDDM, with renal, neuropathic complications / Insulin Overdose in Setting of Suicidal Ideation - Patient admits to overdose as a suicide attempt, psychiatry consulted, recomended inpatient psych - A1C 10.4 showing poor control - no further hypoglycemic episodes, from medicine standpoint can be transferred once bed is available -Given the patient's aggressive behavior and concern for staff safety, allow for liberal glycemic control -CBGs starting to rise as pt now starting to eat more  Schizophrenia, paranoid with active psychosis - risperidone IM if continues to refuse po risperdal -Continues to have paranoid delusions and poor insight -11/25/2014 Patient is more talkative today although he remains paranoid and have delusions; the patient actually ate most of his breakfast and some of his lunch today -11/25/2014 case was discussed with Dr. Glendora Score Risperdal M-tabs to 2 mg bid -11/26/14--patient is medically stable for transfer to psychiatric facility; he is eating better in the past 48 hours; a little more talkative -11/28/2014, mother at the bedside was concerned patient is more somnolent today, the patient is able to respond to questions and got up to eat watermelon -Case discussed with Dr. Elsie Saas on 6/13--> change Risperdal to 2 mg daily at bedtime, 1 mg in the morning, add benztropine  RLE wounds - edema has decreased  - with history of cellulitis RLE in 2015, no active infection noted -Patient is afebrile without leukocytosis or hemodynamic instability -No active infection noted on exam -WBC = 7.7 with stable Hgb  Mild AKI-resolved - suspect a degree of CKD in the setting of diabetic nephropathy, stable -baseline creatinine 0.9-1.2 -Continue IV fluids as the patient's oral intake still remains poor;ultimately, this can be discontinued as his oral intake improves - 11/26/2014--serum creatinine 0.90   Hypokalemia -Repleted -Added potassium to  maintenance fluids -plan to  wean maintenance fluids as pt continues to eat bettter -We'll check and BMP  Diet: Diet regular R Fluid consistency:: Thin  DVT Prophylaxis: Heparin  Code Status: Full Code Family Communication: None Disposition Plan: psych facility when bed available    Procedures/Studies: Dg Tibia/fibula Right  11/15/2014   CLINICAL DATA:  Right lower leg pain and swelling.  EXAM: RIGHT TIBIA AND FIBULA - 2 VIEW  COMPARISON:  Prior radiographs and CTs.  FINDINGS: Extensive deformity and degenerative changes of the hindfoot again noted.  There is no evidence of acute fracture.  No new focal bony abnormalities are noted.  Soft tissue swelling is present.  No radiographic evidence of acute osteomyelitis identified.  IMPRESSION: Unchanged appearance of the right ankle with extensive deformity and degenerative changes of the hindfoot.  Soft tissue swelling without definite acute bony abnormality.   Electronically Signed   By: Harmon Pier M.D.   On: 11/15/2014 23:00   Dg Foot Complete Right  11/15/2014   CLINICAL DATA:  Chronic right foot pain. Current history of diabetes. Initial encounter.  EXAM: RIGHT FOOT COMPLETE - 3+ VIEW  COMPARISON:  Right foot radiographs performed 08/19/2014  FINDINGS: No definite acute osseous erosions are seen to suggest osteomyelitis.  Charcot joint is noted at the hindfoot, somewhat worsened from the prior study, with loss of visualization of the tibiotalar and subtalar joint. Irregularity along the medial aspect of the base of the first proximal phalanx is stable and likely reflects degenerative change. The midfoot appears grossly intact. An os peroneum is noted.  There is mild diffuse osteopenia of visualized osseous structures. Mild soft tissue swelling is noted about the hindfoot.  IMPRESSION: 1. No acute osseous erosions seen. 2. Charcot joint at the hindfoot, somewhat worsened from the prior study. 3. Mild diffuse osteopenia of visualized osseous  structures. 4. Os peroneum noted.   Electronically Signed   By: Roanna Raider M.D.   On: 11/15/2014 22:59         Subjective: Patient wakes up but only intermittently answers questions. He is able to follow one-step commands intermittently. Denies any pain or shortness of breath.  Objective: Filed Vitals:   11/27/14 0858 11/27/14 1351 11/27/14 2115 11/28/14 1422  BP: 138/68 142/69 149/71 117/66  Pulse: 97 115 118 109  Temp: 99.3 F (37.4 C)  98.7 F (37.1 C) 98.9 F (37.2 C)  TempSrc: Oral   Oral  Resp:  Height:      Weight:      SpO2: 92% 96% 96% 96%    Intake/Output Summary (Last 24 hours) at 11/28/14 1554 Last data filed at 11/28/14 1424  Gross per 24 hour  Intake   1600 ml  Output    750 ml  Net    850 ml   Weight change:  Exam:   General:  Pt is alert, follows commands appropriately, not in acute distress  HEENT: No icterus, No thrush,[place]/AT  Cardiovascular: RRR, S1/S2, no rubs, no gallops  Respiratory: CTA bilaterally, no wheezing, no crackles, no rhonchi  Abdomen: Soft/+BS, non tender, non distended, no guarding Extremities: Right foot without any erythema, drainage, crepitance, necrosis Data Reviewed: Basic Metabolic Panel:  Recent Labs Lab 11/21/14 1812 11/26/14 0553  NA 144 141  K 3.5 3.4*  CL 102 105  CO2 27 22  GLUCOSE 97 112*  BUN 18 6  CREATININE 1.42* 0.90  CALCIUM 9.7 8.5*   Liver Function Tests: No results for input(s): AST, ALT, ALKPHOS, BILITOT, PROT, ALBUMIN  in the last 168 hours. No results for input(s): LIPASE, AMYLASE in the last 168 hours. No results for input(s): AMMONIA in the last 168 hours. CBC:  Recent Labs Lab 11/27/14 1442  WBC 7.7  HGB 11.1*  HCT 34.1*  MCV 88.6  PLT 244   Cardiac Enzymes: No results for input(s): CKTOTAL, CKMB, CKMBINDEX, TROPONINI in the last 168 hours. BNP: Invalid input(s): POCBNP CBG:  Recent Labs Lab 11/28/14 0020 11/28/14 0403 11/28/14 0800 11/28/14 1130  11/28/14 1524  GLUCAP 239* 161* 80 141* 175*    Recent Results (from the past 240 hour(s))  Culture, Urine     Status: None   Collection Time: 11/21/14  6:14 PM  Result Value Ref Range Status   Specimen Description URINE, CATHETERIZED  Final   Special Requests NONE  Final   Colony Count NO GROWTH Performed at [facility]   Final   Culture NO GROWTH Performed at [facility]   Final   Report Status 11/22/2014 FINAL  Final     Scheduled Meds: . benztropine  1 mg Oral BID  . heparin  5,000 Units Subcutaneous 3 times per day  . insulin aspart  0-9 Units Subcutaneous 6 times per day  . insulin detemir  6 Units Subcutaneous Daily  . pneumococcal 23 valent vaccine  0.5 mL Intramuscular Tomorrow-1000  . potassium chloride  10 mEq Oral Once  . [START ON 11/29/2014] risperiDONE  1 mg Oral Daily  . risperiDONE  2 mg Oral QHS  . risperiDONE microspheres  25 mg Intramuscular Q14 Days  . tamsulosin  0.4 mg Oral QPC supper   Continuous Infusions: . sodium chloride 0.9 % 1,000 mL with potassium chloride 20 mEq infusion 100 mL/hr at 11/28/14 1451     Viral Schramm, DO  [facility] Pager [phone]  If 7PM-7AM, please contact night-coverage [email or web address] Password [email or web address] 11/28/2014, 3:54 PM   LOS: 13 days

## 2014-11-28 NOTE — Care Management Note (Signed)
Case Management Note  Patient Details  Name: Dale Hunter MRN: [id number] Date of Birth: 01-Apr-1966  Subjective/Objective:    Patient awaiting [facility] placement, Psych CSW following.                Action/Plan:   Expected Discharge Date:                  Expected Discharge Plan:  Psychiatric Hospital  In-House Referral:  Clinical Social Work  Discharge planning Services  CM Consult  Post Acute Care Choice:    Choice offered to:     DME Arranged:    DME Agency:     HH Arranged:    HH Agency:     Status of Service:  Completed, signed off  Medicare Important Message Given:  No Date Medicare IM Given:    Medicare IM give by:    Date Additional Medicare IM Given:    Additional Medicare Important Message give by:     If discussed at Long Length of Stay Meetings, dates discussed:    Additional Comments:  Leone Haven, RN 11/28/2014, 2:52 PM
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
## 2014-11-29 ENCOUNTER — Inpatient Hospital Stay (HOSPITAL_COMMUNITY): Payer: Medicaid Other

## 2014-11-29 DIAGNOSIS — G210 Malignant neuroleptic syndrome: Secondary | ICD-10-CM

## 2014-11-29 DIAGNOSIS — E872 Acidosis, unspecified: Secondary | ICD-10-CM

## 2014-11-29 DIAGNOSIS — R0600 Dyspnea, unspecified: Secondary | ICD-10-CM

## 2014-11-29 DIAGNOSIS — M6289 Other specified disorders of muscle: Secondary | ICD-10-CM

## 2014-11-29 LAB — CBC
HCT: 30.2 % — ABNORMAL LOW (ref 39.0–52.0)
HEMATOCRIT: 30.7 % — AB (ref 39.0–52.0)
HEMOGLOBIN: 10 g/dL — AB (ref 13.0–17.0)
Hemoglobin: 9.7 g/dL — ABNORMAL LOW (ref 13.0–17.0)
MCH: 28.9 pg (ref 26.0–34.0)
MCH: 28.9 pg (ref 26.0–34.0)
MCHC: 32.1 g/dL (ref 30.0–36.0)
MCHC: 32.6 g/dL (ref 30.0–36.0)
MCV: 89.9 fL (ref 78.0–100.0)
MCV: 88.7 fL (ref 78.0–100.0)
PLATELETS: 129 10*3/uL — AB (ref 150–400)
Platelets: 135 10*3/uL — ABNORMAL LOW (ref 150–400)
RBC: 3.36 MIL/uL — ABNORMAL LOW (ref 4.22–5.81)
RBC: 3.46 MIL/uL — ABNORMAL LOW (ref 4.22–5.81)
RDW: 14.6 % (ref 11.5–15.5)
RDW: 14.7 % (ref 11.5–15.5)
WBC: 5.4 10*3/uL (ref 4.0–10.5)
WBC: 5.2 10*3/uL (ref 4.0–10.5)

## 2014-11-29 LAB — BLOOD GAS, ARTERIAL
ACID-BASE DEFICIT: 4.9 mmol/L — AB (ref 0.0–2.0)
Bicarbonate: 18.2 mEq/L — ABNORMAL LOW (ref 20.0–24.0)
Drawn by: 129711
FIO2: 1 %
O2 Saturation: 99.7 %
Patient temperature: 105
TCO2: 19 mmol/L (ref 0–100)
pCO2 arterial: 30.2 mmHg — ABNORMAL LOW (ref 35.0–45.0)
pH, Arterial: 7.414 (ref 7.350–7.450)
pO2, Arterial: 228 mmHg — ABNORMAL HIGH (ref 80.0–100.0)

## 2014-11-29 LAB — URINALYSIS, ROUTINE W REFLEX MICROSCOPIC
Glucose, UA: 100 mg/dL — AB
Ketones, ur: 15 mg/dL — AB
Leukocytes, UA: NEGATIVE
Nitrite: NEGATIVE
PH: 5 (ref 5.0–8.0)
Protein, ur: 100 mg/dL — AB
SPECIFIC GRAVITY, URINE: 1.023 (ref 1.005–1.030)
Urobilinogen, UA: 1 mg/dL (ref 0.0–1.0)

## 2014-11-29 LAB — CK: CK TOTAL: 1081 U/L — AB (ref 49–397)

## 2014-11-29 LAB — BASIC METABOLIC PANEL
Anion gap: 14 (ref 5–15)
BUN: 15 mg/dL (ref 6–20)
CHLORIDE: 107 mmol/L (ref 101–111)
CO2: 18 mmol/L — ABNORMAL LOW (ref 22–32)
CREATININE: 1.54 mg/dL — AB (ref 0.61–1.24)
Calcium: 7.4 mg/dL — ABNORMAL LOW (ref 8.9–10.3)
GFR calc non Af Amer: 52 mL/min — ABNORMAL LOW (ref >60)
GLUCOSE: 184 mg/dL — AB (ref 65–99)
Potassium: 3.7 mmol/L (ref 3.5–5.1)
Sodium: 139 mmol/L (ref 135–145)

## 2014-11-29 LAB — LACTIC ACID, PLASMA
Lactic Acid, Venous: 1.5 mmol/L (ref 0.5–2.0)
Lactic Acid, Venous: 1.4 mmol/L (ref 0.5–2.0)

## 2014-11-29 LAB — URINE MICROSCOPIC-ADD ON

## 2014-11-29 LAB — BRAIN NATRIURETIC PEPTIDE
B NATRIURETIC PEPTIDE 5: 157.7 pg/mL — AB (ref 0.0–100.0)
B Natriuretic Peptide: 132.7 pg/mL — ABNORMAL HIGH (ref 0.0–100.0)

## 2014-11-29 LAB — PROTIME-INR
INR: 1.37 (ref 0.00–1.49)
Prothrombin Time: 17 seconds — ABNORMAL HIGH (ref 11.6–15.2)

## 2014-11-29 LAB — GLUCOSE, CAPILLARY
GLUCOSE-CAPILLARY: 128 mg/dL — AB (ref 65–99)
Glucose-Capillary: 188 mg/dL — ABNORMAL HIGH (ref 65–99)
Glucose-Capillary: 169 mg/dL — ABNORMAL HIGH (ref 65–99)
Glucose-Capillary: 160 mg/dL — ABNORMAL HIGH (ref 65–99)
Glucose-Capillary: 160 mg/dL — ABNORMAL HIGH (ref 65–99)
Glucose-Capillary: 135 mg/dL — ABNORMAL HIGH (ref 65–99)
Glucose-Capillary: 149 mg/dL — ABNORMAL HIGH (ref 65–99)
Glucose-Capillary: 203 mg/dL — ABNORMAL HIGH (ref 65–99)

## 2014-11-29 LAB — APTT: APTT: 69 s — AB (ref 24–37)

## 2014-11-29 LAB — FIBRINOGEN: Fibrinogen: 539 mg/dL — ABNORMAL HIGH (ref 204–475)

## 2014-11-29 LAB — TROPONIN I
TROPONIN I: 0.04 ng/mL — AB (ref <0.031)
Troponin I: 0.05 ng/mL — ABNORMAL HIGH (ref <0.031)
Troponin I: 0.04 ng/mL — ABNORMAL HIGH (ref <0.031)

## 2014-11-29 LAB — PROCALCITONIN: Procalcitonin: 7.62 ng/mL

## 2014-11-29 LAB — CLOSTRIDIUM DIFFICILE BY PCR: CDIFFPCR: NEGATIVE

## 2014-11-29 MED ORDER — BROMOCRIPTINE MESYLATE 2.5 MG PO TABS
2.5000 mg | ORAL_TABLET | Freq: Three times a day (TID) | ORAL | Status: DC
  Administered 2014-11-29: 2.5 mg
  Filled 2014-11-29 (×2): qty 1

## 2014-11-29 MED ORDER — ACETAMINOPHEN 650 MG RE SUPP
650.0000 mg | RECTAL | Status: DC | PRN
  Administered 2014-11-29: 650 mg via RECTAL
  Filled 2014-11-29: qty 1

## 2014-11-29 MED ORDER — CETYLPYRIDINIUM CHLORIDE 0.05 % MT LIQD
7.0000 mL | Freq: Two times a day (BID) | OROMUCOSAL | Status: DC
  Administered 2014-11-29 – 2014-11-30 (×2): 7 mL via OROMUCOSAL

## 2014-11-29 MED ORDER — VANCOMYCIN HCL IN DEXTROSE 750-5 MG/150ML-% IV SOLN
750.0000 mg | Freq: Two times a day (BID) | INTRAVENOUS | Status: DC
  Administered 2014-11-29 – 2014-11-30 (×2): 750 mg via INTRAVENOUS
  Filled 2014-11-29 (×4): qty 150

## 2014-11-29 MED ORDER — SODIUM CHLORIDE 0.9 % IV SOLN
INTRAVENOUS | Status: DC
  Filled 2014-11-29 (×3): qty 1000

## 2014-11-29 MED ORDER — SODIUM BICARBONATE 8.4 % IV SOLN
INTRAVENOUS | Status: DC
  Administered 2014-11-29 – 2014-11-30 (×2): via INTRAVENOUS
  Filled 2014-11-29 (×4): qty 150

## 2014-11-29 MED ORDER — FUROSEMIDE 10 MG/ML IJ SOLN
20.0000 mg | Freq: Three times a day (TID) | INTRAMUSCULAR | Status: AC
  Administered 2014-11-29 – 2014-11-30 (×2): 20 mg via INTRAVENOUS
  Filled 2014-11-29 (×2): qty 2

## 2014-11-29 MED ORDER — BROMOCRIPTINE MESYLATE 2.5 MG PO TABS: 2.5000 mg | ORAL_TABLET | Freq: Three times a day (TID) | ORAL | Status: DC

## 2014-11-29 MED ORDER — BROMOCRIPTINE MESYLATE 2.5 MG PO TABS
2.5000 mg | ORAL_TABLET | Freq: Three times a day (TID) | ORAL | Status: DC
  Administered 2014-11-30 (×2): 2.5 mg
  Filled 2014-11-29 (×4): qty 1

## 2014-11-29 MED ORDER — EMPTY CONTAINERS FLEXIBLE MISC: 1.0000 mg/kg | Freq: Once | Status: DC

## 2014-11-29 MED ORDER — EMPTY CONTAINERS FLEXIBLE MISC
2.5000 mg/kg | Freq: Once | Status: AC
  Administered 2014-11-29: 179 mg via INTRAVENOUS
  Filled 2014-11-29: qty 537

## 2014-11-29 MED ORDER — DANTROLENE SODIUM 20 MG IV SOLR
2.5000 mg/kg | Freq: Once | INTRAVENOUS | Status: DC
  Filled 2014-11-29: qty 537

## 2014-11-29 MED ORDER — PIPERACILLIN-TAZOBACTAM 3.375 G IVPB
3.3750 g | Freq: Three times a day (TID) | INTRAVENOUS | Status: DC
  Administered 2014-11-29 – 2014-11-30 (×3): 3.375 g via INTRAVENOUS
  Filled 2014-11-29 (×6): qty 50

## 2014-11-29 NOTE — Clinical Documentation Improvement (Signed)
PLEASE NOTE IF PRESENT ON ADMISSION Please Clarify Location and Stage of Pressure Ulcer: Possible Clinical Conditions?   Stage  I  Pressure Ulcer   (reddening of the skin) Stage  II Pressure Ulcer  (blister open or unopened) Stage  III Pressure Ulcer (through all layers skin) Stage IV Pressure Ulcer   (through skin & underlying  muscle, tendons, and bones) Other Condition Cannot Clinically Determine   Supporting Information:(As per notes) "Pressure ulcer"  Wound Nurse Assessment (WOC): No note from WOC Nurse  Thank You, Nevin Bloodgood, RN, BSN, CCDS,Clinical Documentation Specialist:  [phone]  [phone]=[facility] [place]- Health Information Management

## 2014-11-29 NOTE — Progress Notes (Signed)
Pt transferred to 2M02, Report given to RN Amy, IV infusing ABX and fluids, Pt awake, eyes open, Transported to unit by [employer] 11/29/2014 5:01 PM Arthor Gorter

## 2014-11-29 NOTE — Progress Notes (Signed)
ANTIBIOTIC CONSULT NOTE - INITIAL  Pharmacy Consult for Vancomycin Indication: rule out sepsis  Allergies  Allergen Reactions  . Garlic Other (See Comments)    STOMACH ISSUES  . Advil [Ibuprofen] Rash    Patient Measurements: Height: 5\' 10"  (177.8 cm) Weight: 157 lb 14.4 oz (71.623 kg) IBW/kg (Calculated) : 73  Vital Signs: Temp: 98.8 F (37.1 C) (06/14 0541) Temp Source: Oral (06/14 0541) BP: 101/56 mmHg (06/14 0541) Pulse Rate: 111 (06/14 0541) Intake/Output from previous day: 06/13 0701 - 06/14 0700 In: 2040 [P.O.:840; I.V.:1200] Out: 450 [Urine:450] Intake/Output from this shift:    Labs:  Recent Labs  11/27/14 1442 11/29/14 0513  WBC 7.7 5.4  HGB 11.1* 9.7*  PLT 244 135*  CREATININE  --  1.54*   Estimated Creatinine Clearance: 60.1 mL/min (by C-G formula based on Cr of 1.54). No results for input(s): VANCOTROUGH, VANCOPEAK, VANCORANDOM, GENTTROUGH, GENTPEAK, GENTRANDOM, TOBRATROUGH, TOBRAPEAK, TOBRARND, AMIKACINPEAK, AMIKACINTROU, AMIKACIN in the last 72 hours.   Microbiology: Recent Results (from the past 720 hour(s))  Blood culture (routine x 2)     Status: None   Collection Time: 11/15/14  7:53 PM  Result Value Ref Range Status   Specimen Description BLOOD LEFT FOREARM  Final   Special Requests BOTTLES DRAWN AEROBIC AND ANAEROBIC 5CC EA  Final   Culture   Final    NO GROWTH 5 DAYS Performed at [facility]    Report Status 11/22/2014 FINAL  Final  Blood culture (routine x 2)     Status: None   Collection Time: 11/15/14  8:05 PM  Result Value Ref Range Status   Specimen Description BLOOD LEFT HAND  Final   Special Requests BOTTLES DRAWN AEROBIC AND ANAEROBIC 10CC EA  Final   Culture   Final    NO GROWTH 5 DAYS Performed at [facility]    Report Status 11/22/2014 FINAL  Final  Urine culture     Status: None   Collection Time: 11/15/14  9:47 PM  Result Value Ref Range Status   Specimen Description URINE, CLEAN CATCH  Final   Special Requests NONE  Final   Colony Count   Final    5,000 COLONIES/ML Performed at [facility]    Culture   Final    INSIGNIFICANT GROWTH Performed at [facility]    Report Status 11/17/2014 FINAL  Final  MRSA PCR Screening     Status: None   Collection Time: 11/16/14 12:26 AM  Result Value Ref Range Status   MRSA by PCR NEGATIVE NEGATIVE Final    Comment:        The GeneXpert MRSA Assay (FDA approved for NASAL specimens only), is one component of a comprehensive MRSA colonization surveillance program. It is not intended to diagnose MRSA infection nor to guide or monitor treatment for MRSA infections.   Culture, Urine     Status: None   Collection Time: 11/21/14  6:14 PM  Result Value Ref Range Status   Specimen Description URINE, CATHETERIZED  Final   Special Requests NONE  Final   Colony Count NO GROWTH Performed at [facility]   Final   Culture NO GROWTH Performed at [facility]   Final   Report Status 11/22/2014 FINAL  Final    Medical History: Past Medical History  Diagnosis Date  . Diabetes mellitus without complication   . Osteomyelitis     Medications:  Scheduled:  . benztropine  1 mg Oral BID  .  insulin aspart  0-9 Units Subcutaneous 6 times per day  . insulin detemir  6 Units Subcutaneous Daily  . piperacillin-tazobactam (ZOSYN)  IV  3.375 g Intravenous Q8H  . pneumococcal 23 valent vaccine  0.5 mL Intramuscular Tomorrow-1000  . potassium chloride  10 mEq Oral Once  . risperiDONE  1 mg Oral Daily  . risperiDONE  2 mg Oral QHS  . risperiDONE microspheres  25 mg Intramuscular Q14 Days   Assessment: Mr. Mish is a 49 yo M who was initially admitted 5/31 following a suicide attempt.  He has been hospitalized since then awaiting appropriate inpatient psych placement.  Today pt was found to have a low grade fever, tachycardia and metabolic acidosis.  To start empiric abx for r/o sepsis.  5/31 urine neg 5/31 blood  x 2 neg 6/6 urine neg  Goal of Therapy:  Vancomycin trough level 15-20 mcg/ml  Plan:  Vancomycin 750 mg IV q12h Continue Zosyn 3.375 gm IV q8h (4 hour infusion) as ordered by MD Follow-up cx data, renal function, and clinical progress Vancomycin level at steady state  [employer], Pharm.D., BCPS Clinical Pharmacist Pager [phone] 11/29/2014 2:54 PM

## 2014-11-29 NOTE — Progress Notes (Signed)
PROGRESS NOTE  Dale Hunter ZOX:[id number] DOB: [birth year] DOA: 11/15/2014 PCP: Default, Provider, MD  Brief History 49 year old male brought via EMS to ED on 11/15/2014 with an episode of severe diabetic hypoglycemia due to an insulin overdose as a suicide attempt. He was initially admitted to SDU and placed on dextrose infusion. As the insulin washed out of his system, the patient's CBGs have improved and actually increased. The patient continues to exhibit florid psychosis with paranoid behavior and aggressive behavior to medical staff. Psychiatry was consulted and made recommendations including Risperdal 1 mg twice a day if the patient is cooperative, Geodon 10 mg IM every 12 hours if the patient is agitated and aggressive and refuses medication, and Risperidone Consta 25 mg IM Q14 days due to non compliance with medication. Risperidone Consta was last given on 11/19/2014. Patient had initial problems with urinary retention. After his Foley catheter was removed on 11/25/2014, the patient was able to void multiple times spontaneously without difficulty. On 11/29/2014, the patient was said to be tachycardic with metabolic acidosis. Sepsis workup was undertaken, and the patient was started on regimen venous antibiotics empirically pending culture data  Assessment/Plan: Dyspnea/low-grade fever/metabolic acidosis -11/29/2014--patient developed increasing tachycardia with low-grade fever -Blood cultures 2 sets -UA and urine culture -Chest x-ray -lactic acid -procalcitonin Thrombocytopenia -Fibrinogen -INR, PTT -HIT panel -d/c heparin Non-compliance with medication regimen/Suicide attempt/Paranoid Schizophrenia - appreciate psychiatry help with this very difficult patient, increasingly violent, geodon PRN per psychiatry - discussed with Dr. Elsie Saas 11/28/14--continue to call security to hold down pt if he refuses labs or meds - able to transfer to psychiatric facility as soon as  bed is available - per psychiatry, patient can be forced for medications/treatments such as insulin, CBGs, depot antipsychotics - SW involved, referral to [facility] made. Wait time per SW 18-23 days.  Acute urinary retention-resolved - 6/6 pm patient with urinary retention, bladder scan showed > 900 cc, Foley placed - Continue Flomax - urinalysis without evidence of infection -11/25/2014--discontinue Foley catheter for voiding trial -11/26/14--pt able to void spontaneously on numerous occasions -11/29/14--d/c flomax--pt has refused it last several days and has been able to void spontaneously  IDDM, with renal, neuropathic complications / Insulin Overdose in Setting of Suicidal Ideation - Patient admits to overdose as a suicide attempt, psychiatry consulted, recomended inpatient psych - A1C 10.4 showing poor control - no further hypoglycemic episodes, from medicine standpoint can be transferred once bed is available -Given the patient's aggressive behavior and concern for staff safety, allow for liberal glycemic control -CBGs starting to rise as pt now starting to eat more -continue CBG every 4 hours as pt is non consistently eating   Schizophrenia, paranoid with active psychosis - risperidone IM if continues to refuse po risperdal -Continues to have paranoid delusions and poor insight -11/25/2014 Patient is more talkative today although he remains paranoid and have delusions; the patient actually ate most of his breakfast and some of his lunch today -11/25/2014 case was discussed with Dr. Glendora Score Risperdal M-tabs to 2 mg bid -11/26/14--patient is medically stable for transfer to psychiatric facility; he is eating better in the past 48 hours; a little more talkative -11/28/2014, mother at the bedside was concerned patient is more somnolent today, the patient is able to respond to questions and got up to eat watermelon -Case discussed with Dr. Elsie Saas on 6/13-->  change Risperdal to 2 mg daily at bedtime, 1 mg in the morning, add  benztropine  RLE wounds - edema has decreased  - with history of cellulitis RLE in 2015, no active infection noted -Patient is afebrile without leukocytosis or hemodynamic instability -No active infection noted on exam -WBC =5.4  AKI - suspect a degree of CKD in the setting of diabetic nephropathy, stable -baseline creatinine 0.9-1.2 -Continue IV fluids as the patient's oral intake still remains poor;ultimately, this can be discontinued as his oral intake improves - 11/26/2014--serum creatinine 0.90  -11/29/2014--worsening renal function with serum creatinine 1.54--suspect a degree of ATN with possibility of new infection -Continue IV fluids -Urinalysis  Hypokalemia -Repleted -Added potassium to maintenance fluids -plan to wean maintenance fluids as pt continues to eat bettter -am labs  Diet: Diet regular R Fluid consistency:: Thin  DVT Prophylaxis: Heparin  Code Status: Full Code Family Communication: None Disposition Plan: psych facility when bed available     Procedures/Studies: Dg Tibia/fibula Right  11/15/2014   CLINICAL DATA:  Right lower leg pain and swelling.  EXAM: RIGHT TIBIA AND FIBULA - 2 VIEW  COMPARISON:  Prior radiographs and CTs.  FINDINGS: Extensive deformity and degenerative changes of the hindfoot again noted.  There is no evidence of acute fracture.  No new focal bony abnormalities are noted.  Soft tissue swelling is present.  No radiographic evidence of acute osteomyelitis identified.  IMPRESSION: Unchanged appearance of the right ankle with extensive deformity and degenerative changes of the hindfoot.  Soft tissue swelling without definite acute bony abnormality.   Electronically Signed   By: Harmon Pier M.D.   On: 11/15/2014 23:00   Dg Foot Complete Right  11/15/2014   CLINICAL DATA:  Chronic right foot pain. Current history of diabetes. Initial encounter.  EXAM: RIGHT FOOT COMPLETE -  3+ VIEW  COMPARISON:  Right foot radiographs performed 08/19/2014  FINDINGS: No definite acute osseous erosions are seen to suggest osteomyelitis.  Charcot joint is noted at the hindfoot, somewhat worsened from the prior study, with loss of visualization of the tibiotalar and subtalar joint. Irregularity along the medial aspect of the base of the first proximal phalanx is stable and likely reflects degenerative change. The midfoot appears grossly intact. An os peroneum is noted.  There is mild diffuse osteopenia of visualized osseous structures. Mild soft tissue swelling is noted about the hindfoot.  IMPRESSION: 1. No acute osseous erosions seen. 2. Charcot joint at the hindfoot, somewhat worsened from the prior study. 3. Mild diffuse osteopenia of visualized osseous structures. 4. Os peroneum noted.   Electronically Signed   By: Roanna Raider M.D.   On: 11/15/2014 22:59         Subjective: Patient opens his eyes and intermittently response. He is able to tell me that he is not short of breath or having any chest pain or abdominal pain at this time. Otherwise he refuses to answer questions  Objective: Filed Vitals:   11/27/14 2115 11/28/14 1422 11/28/14 2204 11/29/14 0541  BP: 149/71 117/66 124/68 101/56  Pulse: 118 109 125 111  Temp: 98.7 F (37.1 C) 98.9 F (37.2 C) 99.3 F (37.4 C) 98.8 F (37.1 C)  TempSrc:  Oral Oral Oral  Resp: 20 18 20 20   Height:      Weight:      SpO2: 96% 96% 93% 92%    Intake/Output Summary (Last 24 hours) at 11/29/14 1421 Last data filed at 11/29/14 1157  Gross per 24 hour  Intake   1800 ml  Output    450 ml  Net  1350 ml   Weight change:  Exam:   General:  Pt is alert, does not follow commands, not in acute distress  HEENT: No icterus, No thrush, [place]/AT  Cardiovascular: RRR, S1/S2, no rubs, no gallops  Respiratory: CTA bilaterally, no wheezing, no crackles, no rhonchi  Abdomen: Soft/+BS, non tender, non distended, no guarding Extremities:  Lower extremity without any erythema, crepitance, or peripheral draining wounds. No necrosis. Data Reviewed:  Basic Metabolic Panel:  Recent Labs Lab 11/26/14 0553 11/29/14 0513  NA 141 139  K 3.4* 3.7  CL 105 107  CO2 22 18*  GLUCOSE 112* 184*  BUN 6 15  CREATININE 0.90 1.54*  CALCIUM 8.5* 7.4*   Liver Function Tests: No results for input(s): AST, ALT, ALKPHOS, BILITOT, PROT, ALBUMIN in the last 168 hours. No results for input(s): LIPASE, AMYLASE in the last 168 hours. No results for input(s): AMMONIA in the last 168 hours. CBC:  Recent Labs Lab 11/27/14 1442 11/29/14 0513  WBC 7.7 5.4  HGB 11.1* 9.7*  HCT 34.1* 30.2*  MCV 88.6 89.9  PLT 244 135*   Cardiac Enzymes: No results for input(s): CKTOTAL, CKMB, CKMBINDEX, TROPONINI in the last 168 hours. BNP: Invalid input(s): POCBNP CBG:  Recent Labs Lab 11/28/14 2010 11/29/14 0037 11/29/14 0442 11/29/14 0815 11/29/14 1145  GLUCAP 122* 128* 188* 169* 160*    Recent Results (from the past 240 hour(s))  Culture, Urine     Status: None   Collection Time: 11/21/14  6:14 PM  Result Value Ref Range Status   Specimen Description URINE, CATHETERIZED  Final   Special Requests NONE  Final   Colony Count NO GROWTH Performed at [facility]   Final   Culture NO GROWTH Performed at [facility]   Final   Report Status 11/22/2014 FINAL  Final     Scheduled Meds: . benztropine  1 mg Oral BID  . insulin aspart  0-9 Units Subcutaneous 6 times per day  . insulin detemir  6 Units Subcutaneous Daily  . pneumococcal 23 valent vaccine  0.5 mL Intramuscular Tomorrow-1000  . potassium chloride  10 mEq Oral Once  . risperiDONE  1 mg Oral Daily  . risperiDONE  2 mg Oral QHS  . risperiDONE microspheres  25 mg Intramuscular Q14 Days   Continuous Infusions: . sodium chloride 0.9 % 1,000 mL with potassium chloride 20 mEq infusion 100 mL/hr at 11/29/14 0026     Dale Spillman, DO  [facility] Pager  [phone]  If 7PM-7AM, please contact night-coverage [email or web address] Password [email or web address] 11/29/2014, 2:21 PM   LOS: 14 days

## 2014-11-29 NOTE — Progress Notes (Signed)
eLink Physician-Brief Progress Note Patient Name: Dale Hunter DOB: 12-06-1965 MRN: [id number]   Date of Service  11/29/2014  HPI/Events of Note  Best Practice  eICU Interventions  SCDs for DVT propy     Intervention Category Intermediate Interventions: Best-practice therapies (e.g. DVT, beta blocker, etc.)  Kenton Fortin 11/29/2014, 11:47 PM
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
## 2014-11-29 NOTE — Progress Notes (Signed)
VSS - Blood pressure 146/66, pulse 129, temperature 101.3 F (38.5 C), temperature source Axillary, resp. rate 16, height 5\' 10"  (1.778 m), weight 71.623 kg (157 lb 14.4 oz), SpO2 64 %., R/A.  Placed on 2L, sats only up to 80's.  Increased O2 to 4 l, sats up to 84-86%, increased to 6l sats up to low 90's.  Placed on non-rebreather sats up to 99%.  Informed Dr. Arbutus Leas wanted to wait until after blood cultures before giving tylenol supp.  & wanted STAT abg drawn.  Resp. Called.  Will continue to monitor.  Forbes Cellar, RN

## 2014-11-29 NOTE — Progress Notes (Addendum)
When I evaluated the patient around1400 today, the patient was awake and alert but more somnolent than in the previous days. He would still answer yes/no to chest pain or shortness of breath or abdominal pain. Shortly thereafter, vital signs were obtained and revealed temperature 105.93F, HR 115-RR20-145/82--the patient was hypoxemic, but pulse oximetry was not registering well as a result of the patient's tremors and agitation., Pulse oximetry initially showed mid 60s. The patient was placed on a nonrebreather. After approximately 15 minutes on a nonrebreather, the patient blood gas was obtained revealing 7.414/30/228/18 on NRB  Around 1415, blood work was obtained including HRT panel, INR, PTT, fibrinogen, procalcitonin, blood cultures 2 sets, UA and urine culture, lactic acid, CPK, BNP  Empiric antibiotics were ordered. Acetaminophen was ordered once the blood cultures were obtained. There is concern for neuroleptic malignant syndrome. The patient's case was discussed with critical care medicine, Dr. Loletha Grayer who graciously accepted the patient for ICU care. Risperdal and Risperdal Consta were d/c.  Risperdal Consta initially scheduled to be given again on 12/02/14.  Dr. Arsenio Loader agreed that Dantrolene should be started. The patient's mother was updated at the bedside and all questions were answered to her satisfaction. DTat
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
## 2014-11-29 NOTE — Consult Note (Signed)
Neurology Consultation Reason for Consult: Altered Mental Status Referring Physician: Claudette Head  CC: AMS  History is obtained from:Patient, chart  HPI: Kule Gascoigne is a 49 y.o. male who was admitted with insulin overdose who was awaiting placement in a psychiatric facility when over the course of the day he had decreasing mental status and increasing temperature. He was documented as having a fever of 15 and it was noticed that he had increased tone.   Due to concern for possible NMS risperdal has been discontinued and   He denies headache, and I am able to get him to touch his chin to his chest.   When checking strength he states "I don't want to hurt you" and answers some yes/no questions reliably   ROS:  Unable to obtain due to altered mental status.   Past Medical History  Diagnosis Date  . Diabetes mellitus without complication   . Osteomyelitis     Family History: Unable to assess secondary to patient's altered mental status.    Social History: Tob: Unable to assess secondary to patient's altered mental status.    Exam: Current vital signs: BP 120/69 mmHg  Pulse 110  Temp(Src) 101.2 F (38.4 C) (Oral)  Resp 20  Ht  (1.778 m)  Wt 71.623 kg (157 lb 14.4 oz)  BMI 22.66 kg/m2  SpO2 100% Vital signs in last 24 hours: Temp:  [98.8 F (37.1 C)-105.2 F (40.7 C)] 101.2 F (38.4 C) (06/14 1651) Pulse Rate:  [110-129] 110 (06/14 1648) Resp:  [16-20] 20 (06/14 1648) BP: (101-146)/(56-82) 120/69 mmHg (06/14 1648) SpO2:  [64 %-100 %] 100 % (06/14 1648)  Physical Exam  Constitutional: Appears well-developed and well-nourished.  Psych: Affect appropriate to situation Eyes: No scleral injection HENT: No OP obstrucion Head: Normocephalic.  Cardiovascular: Tachycardic Respiratory: Effort normal and breath sounds normal to anterior ascultation GI: Soft.  No distension. There is no tenderness.  Skin: WDI, left BKA  Neuro: Mental Status: Patient is awake, he  mumbles his responses and is frequently hard to understand. He has variable participation in the exam and refuses to answer questions such as orientation questions. Cranial Nerves: II: Blinks to threat bilaterally. Pupils are equal, round, and reactive to light.   III,IV, VI: EOMI without ptosis or diploplia.  V: Facial sensation is symmetric to temperature VII: Facial movement is symmetric.  VIII: hearing is intact to voice X, XI, XII: Unable to assess secondary to patient's altered mental status.  Motor: Tone is increased throughout with rigidity. Bulk is normal. He has good strength throughout, though does not cooperate for formal testing. Sensory: He responds to stimulation in all 4 extremities Cerebellar: Patient does not comply, he does appear tremulous   I have reviewed labs in epic and the results pertinent to this consultation are: Mildly elevated creatinine which is new from previous Mildly low hgb  Impression: 49 year old male with new altered mental status, severe hyperthermia to 105, increased tone (though milder than is usually expected with NMS), tachycardia. 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 Of note he is getting a long-acting form of Risperdal consta.  Other possibilities be increased tone due to his neuroleptics with the other symptoms being unrelated due to some infection. He is being screened for infection by internal medicine. He has no clear meningismus and is able to clearly deny any headache and I have low suspicion for meningeal infection at this time.  Given the degree of concern I have for NMS, I would favor starting treatment with dantrolene and bromocriptine given the fact that he is essentially gone to remain on the Risperdal for several more days at least due to being on the long-acting form.  Recommendations: 1) dantrolene x1, if he continues to have  severe elevations in his temperature, this can be repeated 2) bromocriptine 2.5 mg 3 times a day 3) CK 4) neurology will continue to follow   Ritta Slot, MD [facility] [phone]  If 7pm- 7am, please page neurology on call as listed in AMION.

## 2014-11-30 ENCOUNTER — Inpatient Hospital Stay (HOSPITAL_COMMUNITY): Payer: Medicaid Other

## 2014-11-30 ENCOUNTER — Encounter (HOSPITAL_COMMUNITY): Payer: Self-pay | Admitting: Certified Registered Nurse Anesthetist

## 2014-11-30 DIAGNOSIS — F200 Paranoid schizophrenia: Secondary | ICD-10-CM

## 2014-11-30 DIAGNOSIS — J810 Acute pulmonary edema: Secondary | ICD-10-CM

## 2014-11-30 DIAGNOSIS — E1059 Type 1 diabetes mellitus with other circulatory complications: Secondary | ICD-10-CM

## 2014-11-30 DIAGNOSIS — A4150 Gram-negative sepsis, unspecified: Secondary | ICD-10-CM

## 2014-11-30 DIAGNOSIS — R0600 Dyspnea, unspecified: Secondary | ICD-10-CM

## 2014-11-30 DIAGNOSIS — R7881 Bacteremia: Secondary | ICD-10-CM | POA: Insufficient documentation

## 2014-11-30 LAB — BASIC METABOLIC PANEL
ANION GAP: 10 (ref 5–15)
BUN: 17 mg/dL (ref 6–20)
CO2: 26 mmol/L (ref 22–32)
CREATININE: 1.45 mg/dL — AB (ref 0.61–1.24)
Calcium: 7.7 mg/dL — ABNORMAL LOW (ref 8.9–10.3)
Chloride: 103 mmol/L (ref 101–111)
GFR calc Af Amer: >60 mL/min (ref >60)
GFR, EST NON AFRICAN AMERICAN: 56 mL/min — AB (ref >60)
Glucose, Bld: 266 mg/dL — ABNORMAL HIGH (ref 65–99)
Potassium: 4.4 mmol/L (ref 3.5–5.1)
SODIUM: 139 mmol/L (ref 135–145)

## 2014-11-30 LAB — GLUCOSE, CAPILLARY
GLUCOSE-CAPILLARY: 254 mg/dL — AB (ref 65–99)
GLUCOSE-CAPILLARY: 249 mg/dL — AB (ref 65–99)
GLUCOSE-CAPILLARY: 180 mg/dL — AB (ref 65–99)
Glucose-Capillary: 189 mg/dL — ABNORMAL HIGH (ref 65–99)
Glucose-Capillary: 239 mg/dL — ABNORMAL HIGH (ref 65–99)
Glucose-Capillary: 252 mg/dL — ABNORMAL HIGH (ref 65–99)

## 2014-11-30 LAB — COMPREHENSIVE METABOLIC PANEL
ALT: 77 U/L — AB (ref 17–63)
AST: 78 U/L — AB (ref 15–41)
Albumin: 2.2 g/dL — ABNORMAL LOW (ref 3.5–5.0)
Alkaline Phosphatase: 65 U/L (ref 38–126)
Anion gap: 11 (ref 5–15)
BILIRUBIN TOTAL: 1.1 mg/dL (ref 0.3–1.2)
BUN: 17 mg/dL (ref 6–20)
CHLORIDE: 106 mmol/L (ref 101–111)
CO2: 21 mmol/L — ABNORMAL LOW (ref 22–32)
Calcium: 7.5 mg/dL — ABNORMAL LOW (ref 8.9–10.3)
Creatinine, Ser: 1.51 mg/dL — ABNORMAL HIGH (ref 0.61–1.24)
GFR calc Af Amer: >60 mL/min (ref >60)
GFR calc non Af Amer: 53 mL/min — ABNORMAL LOW (ref >60)
Glucose, Bld: 270 mg/dL — ABNORMAL HIGH (ref 65–99)
POTASSIUM: 4.4 mmol/L (ref 3.5–5.1)
Sodium: 138 mmol/L (ref 135–145)
TOTAL PROTEIN: 5.5 g/dL — AB (ref 6.5–8.1)

## 2014-11-30 LAB — URINE CULTURE: CULTURE: NO GROWTH

## 2014-11-30 LAB — TROPONIN I
TROPONIN I: 0.04 ng/mL — AB (ref <0.031)
Troponin I: 0.04 ng/mL — ABNORMAL HIGH (ref <0.031)

## 2014-11-30 LAB — CBC
HCT: 34 % — ABNORMAL LOW (ref 39.0–52.0)
Hemoglobin: 11.2 g/dL — ABNORMAL LOW (ref 13.0–17.0)
MCH: 29.2 pg (ref 26.0–34.0)
MCHC: 32.9 g/dL (ref 30.0–36.0)
MCV: 88.8 fL (ref 78.0–100.0)
Platelets: 128 10*3/uL — ABNORMAL LOW (ref 150–400)
RBC: 3.83 MIL/uL — ABNORMAL LOW (ref 4.22–5.81)
RDW: 14.8 % (ref 11.5–15.5)
WBC: 6.5 10*3/uL (ref 4.0–10.5)

## 2014-11-30 LAB — MAGNESIUM: MAGNESIUM: 1.6 mg/dL — AB (ref 1.7–2.4)

## 2014-11-30 LAB — PHOSPHORUS: Phosphorus: 3 mg/dL (ref 2.5–4.6)

## 2014-11-30 LAB — BRAIN NATRIURETIC PEPTIDE: B Natriuretic Peptide: 200.8 pg/mL — ABNORMAL HIGH (ref 0.0–100.0)

## 2014-11-30 MED ORDER — CHLORHEXIDINE GLUCONATE 0.12 % MT SOLN
15.0000 mL | Freq: Two times a day (BID) | OROMUCOSAL | Status: DC
  Administered 2014-12-01 (×2): 15 mL via OROMUCOSAL
  Filled 2014-11-30 (×3): qty 15

## 2014-11-30 MED ORDER — CETYLPYRIDINIUM CHLORIDE 0.05 % MT LIQD
7.0000 mL | Freq: Two times a day (BID) | OROMUCOSAL | Status: DC
  Administered 2014-12-01 (×2): 7 mL via OROMUCOSAL

## 2014-11-30 MED ORDER — CIPROFLOXACIN IN D5W 400 MG/200ML IV SOLN
400.0000 mg | Freq: Two times a day (BID) | INTRAVENOUS | Status: DC
  Administered 2014-11-30 – 2014-12-02 (×4): 400 mg via INTRAVENOUS
  Filled 2014-11-30 (×5): qty 200

## 2014-11-30 MED ORDER — FUROSEMIDE 10 MG/ML IJ SOLN
40.0000 mg | Freq: Once | INTRAMUSCULAR | Status: AC
  Administered 2014-11-30: 40 mg via INTRAVENOUS
  Filled 2014-11-30: qty 4

## 2014-11-30 MED ORDER — DEXTROSE 5 % IV SOLN
1.0000 g | Freq: Three times a day (TID) | INTRAVENOUS | Status: DC
  Administered 2014-11-30 – 2014-12-02 (×6): 1 g via INTRAVENOUS
  Filled 2014-11-30 (×7): qty 1

## 2014-11-30 NOTE — Clinical Social Work Psych Note (Signed)
Pt remains on the [facility] waitlist.  Vickii Penna, LCSW [phone]  Psychiatric & Orthopedics (5N 1-8) Clinical Social Worker

## 2014-11-30 NOTE — Consult Note (Signed)
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 Maultsby,JANARDHAHA R. 11/30/2014 12:07 PM

## 2014-11-30 NOTE — Progress Notes (Signed)
PULMONARY / CRITICAL CARE MEDICINE   Name: Dale Hunter MRN: [id number] DOB: 1965/11/17    ADMISSION DATE:  11/15/2014 CONSULTATION DATE:  11/29/2014  REFERRING MD :  Dr. Carles Collet  CHIEF COMPLAINT:  Fever  INITIAL PRESENTATION: 16 schizophrenic M with DM adm to [facility] 5/31 with intentional insulin overdose, he was cleared medically and pending behavioral health placement, when encephalopathy worsened 6/14 and he developed rigidity, fever up to 105F rectally. PCCM called for further eval.    MAJOR EVENTS/TEST RESULTS: 5/31 admitted to [facility] service with intentional insulin OD 6/01 Psych consultation 6/14 High fever, respiratory distress. Transferred to ICU/PCCM service. Concern for NMS. Received dantrolene and bromocriptine. Concern for sepsis. V/Z initiated. CXR c/w pulm edema 6/15 Procalcitonin elevated (7.62). Blood cultures positive for GNRs. Abx adjusted 6/15 TTE:   INDWELLING DEVICES::   MICRO DATA: Urine 5/31 >> NEG 5/31 blood >> NEG MRSA PCR 6/01 >> NEG Urine 6/06 >> NEG C diff 6/14 >> NEG Blood 6/14 >> 2/2 GNRs >>  Urine 6/14 >>    ANTIMICROBIALS:  Vanc 6/14 >> 6/15 Zosyn 6/14 >> 6/15 Cipro 6/15 >>  Ceftaz 6/15 >>   SUBJECTIVE:  RASS 0. Mildly tachypneic. No overt resp distress. Not F/C. Nonconversant  VITAL SIGNS: Temp:  [97.5 F (36.4 C)-105.2 F (40.7 C)] 98 F (36.7 C) (06/15 1156) Pulse Rate:  [88-129] 111 (06/15 1300) Resp:  [15-30] 21 (06/15 1300) BP: (102-171)/(66-106) 132/76 mmHg (06/15 1300) SpO2:  [64 %-100 %] 94 % (06/15 1300) HEMODYNAMICS:   VENTILATOR SETTINGS:   INTAKE / OUTPUT:  Intake/Output Summary (Last 24 hours) at 11/30/14 1412 Last data filed at 11/30/14 1000  Gross per 24 hour  Intake   2547 ml  Output   1750 ml  Net    797 ml    PHYSICAL EXAMINATION: General:  NAD Neuro:  RASS 0. No focal deficits HEENT:  [place]/AT, WNL Cardiovascular:  Tachy, regular, no M Lungs: bibasialr crackles Abdomen:  Soft, non-tender, non-distended Ext: L  BKA, no edema, warm. Prevalon boot on R foot  LABS:  CBC  Recent Labs Lab 11/29/14 0513 11/29/14 1600 11/30/14 0447  WBC 5.4 5.2 6.5  HGB 9.7* 10.0* 11.2*  HCT 30.2* 30.7* 34.0*  PLT 135* 129* 128*   Coag's  Recent Labs Lab 11/29/14 1530  APTT 69*  INR 1.37   BMET  Recent Labs Lab 11/29/14 0513 11/29/14 2325 11/30/14 0447  NA 139 138 139  K 3.7 4.4 4.4  CL 107 106 103  CO2 18* 21* 26  BUN '15 17 17  ' CREATININE 1.54* 1.51* 1.45*  GLUCOSE 184* 270* 266*   Electrolytes  Recent Labs Lab 11/29/14 0513 11/29/14 2325 11/30/14 0447  CALCIUM 7.4* 7.5* 7.7*  MG  --   --  1.6*  PHOS  --   --  3.0   Sepsis Markers  Recent Labs Lab 11/29/14 1530 11/29/14 1546 11/29/14 1854  LATICACIDVEN  --  1.5 1.4  PROCALCITON 7.62  --   --    ABG  Recent Labs Lab 11/29/14 1520  PHART 7.414  PCO2ART 30.2*  PO2ART 228*   Liver Enzymes  Recent Labs Lab 11/29/14 2325  AST 78*  ALT 77*  ALKPHOS 65  BILITOT 1.1  ALBUMIN 2.2*   Cardiac Enzymes  Recent Labs Lab 11/29/14 2124 11/29/14 2325 11/30/14 0447  TROPONINI 0.04* 0.04* 0.04*   Glucose  Recent Labs Lab 11/29/14 1705 11/29/14 2018 11/29/14 2350 11/30/14 0408 11/30/14 0735 11/30/14 1152  GLUCAP 135* 203* 252*  239* 254* 249*    CXR: NNF (6/14 CXR reviewed - edema pattern)  ASSESSMENT / PLAN:  PULMONARY A:  Hypoxic respiratory failure Pulm edema vs ARDS (favor edema) P:   Cont supp O2 Cont to monitor resp status in ICU Lasix ordered 6/15  CARDIOVASCULAR A:  Sinus tachycardia Abnormal EKG - appearance of ant-septal MI (chronic) P:  Monitor hemodynamics, rhythm Check BNP 6/15 TTE ordered 6/15  RENAL A:  AKI - Cr improving Met acidosis, resolved P:   Monitor BMET intermittently Monitor I/Os Correct electrolytes as indicated  GASTROINTESTINAL A:   Diarrhea, C diff neg P:   SUP: N/I NPO until resp status improves  HEMATOLOGIC A:   Mild anemia without acute blood  loss Mild acute thrombocytopenia P:  DVT px: SCDs Monitor CBC intermittently Transfuse per usual guidelines  INFECTIOUS A:   High fever, resolved GNR bacteremia - unclear source Severe sepsis P:   Monitor temp, WBC count Micro and abx as above  ENDOCRINE A:  DM 1 P:   Cont Levemir Cont SSI - sens scale Change SSI to ACHS once taking diet  NEUROLOGIC A:   Schizophrenia Intentional insulin OD Doubt NMS (alternative explanation for high fever, etc)  Elevated CK noted P:   RASS goal: 0 DC bromocriptine Recheck CK in AM 6/16 Avoid neuroleptics for now Psych following - discussed with Dr Louretta Shorten  CCM X 26 mins   Merton Border, MD ; [facility] service Mobile [phone].  After 5:30 PM or weekends, call [phone]

## 2014-11-30 NOTE — Progress Notes (Signed)
CRITICAL VALUE ALERT  Critical value received:  Gram - Rods in blood culture  Date of notification:  11/29/14  Time of notification:  1322  Critical value read back:Yes.    Nurse who received alert:  Fuller Canada RN  MD notified (1st page):  Dr Sung Amabile  Time of first page:  1323  MD notified (2nd page): 1323  Time of second page:  Responding MD:  Dr Sung Amabile  Time MD responded:  [phone]

## 2014-11-30 NOTE — Progress Notes (Signed)
ANTIBIOTIC CONSULT NOTE - Follow-up  Pharmacy Consult for Ceftazidime + cipro Indication: bacteremia  Allergies  Allergen Reactions  . Garlic Other (See Comments)    STOMACH ISSUES  . Advil [Ibuprofen] Rash    Patient Measurements: Height:  (177.8 cm) Weight: 157 lb 14.4 oz (71.623 kg) IBW/kg (Calculated) : 73  Vital Signs: Temp: 98 F (36.7 C) (06/15 1156) Temp Source: Oral (06/15 1156) BP: 136/83 mmHg (06/15 1400) Pulse Rate: 110 (06/15 1400) Intake/Output from previous day: 06/14 0701 - 06/15 0700 In: 2337 [I.V.:1400; IV Piggyback:937] Out: 850 [Urine:850] Intake/Output from this shift: Total I/O In: 210 [P.O.:60; I.V.:100; IV Piggyback:50] Out: 900 [Urine:900]  Labs:  Recent Labs  11/29/14 0513 11/29/14 1600 11/29/14 2325 11/30/14 0447  WBC 5.4 5.2  --  6.5  HGB 9.7* 10.0*  --  11.2*  PLT 135* 129*  --  128*  CREATININE 1.54*  --  1.51* 1.45*   Estimated Creatinine Clearance: 63.8 mL/min (by C-G formula based on Cr of 1.45). No results for input(s): VANCOTROUGH, VANCOPEAK, VANCORANDOM, GENTTROUGH, GENTPEAK, GENTRANDOM, TOBRATROUGH, TOBRAPEAK, TOBRARND, AMIKACINPEAK, AMIKACINTROU, AMIKACIN in the last 72 hours.   Microbiology: Recent Results (from the past 720 hour(s))  Blood culture (routine x 2)     Status: None   Collection Time: 11/15/14  7:53 PM  Result Value Ref Range Status   Specimen Description BLOOD LEFT FOREARM  Final   Special Requests BOTTLES DRAWN AEROBIC AND ANAEROBIC 5CC EA  Final   Culture   Final    NO GROWTH 5 DAYS Performed at [facility]    Report Status 11/22/2014 FINAL  Final  Blood culture (routine x 2)     Status: None   Collection Time: 11/15/14  8:05 PM  Result Value Ref Range Status   Specimen Description BLOOD LEFT HAND  Final   Special Requests BOTTLES DRAWN AEROBIC AND ANAEROBIC 10CC EA  Final   Culture   Final    NO GROWTH 5 DAYS Performed at [facility]    Report Status 11/22/2014 FINAL   Final  Urine culture     Status: None   Collection Time: 11/15/14  9:47 PM  Result Value Ref Range Status   Specimen Description URINE, CLEAN CATCH  Final   Special Requests NONE  Final   Colony Count   Final    5,000 COLONIES/ML Performed at [facility]    Culture   Final    INSIGNIFICANT GROWTH Performed at [facility]    Report Status 11/17/2014 FINAL  Final  MRSA PCR Screening     Status: None   Collection Time: 11/16/14 12:26 AM  Result Value Ref Range Status   MRSA by PCR NEGATIVE NEGATIVE Final    Comment:        The GeneXpert MRSA Assay (FDA approved for NASAL specimens only), is one component of a comprehensive MRSA colonization surveillance program. It is not intended to diagnose MRSA infection nor to guide or monitor treatment for MRSA infections.   Culture, Urine     Status: None   Collection Time: 11/21/14  6:14 PM  Result Value Ref Range Status   Specimen Description URINE, CATHETERIZED  Final   Special Requests NONE  Final   Colony Count NO GROWTH Performed at [facility]   Final   Culture NO GROWTH Performed at [facility]   Final   Report Status 11/22/2014 FINAL  Final  Culture, blood (routine x 2)  Status: None (Preliminary result)   Collection Time: 11/29/14  3:30 PM  Result Value Ref Range Status   Specimen Description BLOOD RIGHT HAND  Final   Special Requests BOTTLES DRAWN AEROBIC ONLY 10CC  Final   Culture   Final    GRAM NEGATIVE RODS Note: Gram Stain Report Called to,Read Back By and Verified With: AMY KEETON BY INGRAM A 11/30/14 110PM Performed at [facility]    Report Status PENDING  Incomplete  Clostridium Difficile by PCR (not at [facility])     Status: None   Collection Time: 11/29/14  3:38 PM  Result Value Ref Range Status   C difficile by pcr NEGATIVE NEGATIVE Final  Culture, blood (routine x 2)     Status: None (Preliminary result)   Collection Time: 11/29/14  3:46 PM  Result Value Ref  Range Status   Specimen Description BLOOD RIGHT FOREARM  Final   Special Requests BOTTLES DRAWN AEROBIC ONLY 7CC  Final   Culture   Final    GRAM NEGATIVE RODS Note: Gram Stain Report Called to,Read Back By and Verified With: AMY KEETON BY INGRAM A 11/30/14 110PM Performed at [facility]    Report Status PENDING  Incomplete   Assessment: Dale Hunter is a 49 yo M who was initially admitted 5/31 following a suicide attempt. He has been hospitalized since then awaiting appropriate inpatient psych placement. Started on broad-spectrum vancomycin + zosyn yesterday for possible sepsis. Blood cultures returned today showing gram negative rods. Changing therapy to ciprofloxacin + ceftazidime. Tmax 105.2, WBC WNL. Scr 1.45.  Cipro 6/15>> Ceftaz 6/15>> Vanc 6/14 >>6/15 Zosyn 6/14 >>6/15  5/31 urine - neg 5/31 blood x 2 - neg 6/6 urine - neg 6/14 blood x 2 - GNR 6/14 urine - NGTD 6/14 Cdiff - NEG  Goal of Therapy:  Eradication of infection  Plan:  - Cipro 400mg  IV Q12H - Ceftazidime 1gm IV Q8H - F/u renal fxn, C&S, clinical status   Lysle Pearl, PharmD, BCPS Pager # [phone] 11/30/2014 2:44 PM

## 2014-11-30 NOTE — Progress Notes (Signed)
Subjective: Nursing states that he is not cooperative until something is done to him(e.g. If NG is attempted patient states clearly "I can eat" but is refusing to be cooperative otherwise.)  Exam: Filed Vitals:   11/30/14 0738  BP:   Pulse:   Temp: 98.2 F (36.8 C)  Resp:    Gen: In bed, NAD Abd: soft.  MS: Awake, able to follow simple commands, but does not cooperate well.  XL:KGMW, blinks to threat bilaerally.  Motor: continued mild increase in tone, though improved from yesterday Sensory:responds to nox stim x 4.   Pertinent Labs: Bmp - unremarkable Blood cultures pending.   Impression: 49 yo M with worsesned altered mental status, severe hyperthermia to 105, increased tone (though milder than is usually expected with NMS), tachycardia and elevated CK in the setting of antipsychotic use. He has defervesced nicely.   With this constellation of symptoms, I continue to suspect that this does indeed represent NMS, though if another cause of his fever were found, another possibility would be that the tone is EPS from his anti-psychotics and unrelated to the other symptoms.   At this time, however, I would favor continuing treatment for presumed NMS though this will pose significant difficulty in this gentleman with paranoid schizophrenia.   Recommendations: 1) Continue bromocriptine.  2) hold antipsychotics.  3) appreciate psychiatry assistance.   Ritta Slot, MD [facility] [phone]  If 7pm- 7am, please page neurology on call as listed in AMION.

## 2014-11-30 NOTE — Progress Notes (Signed)
  Echocardiogram 2D Echocardiogram has been performed.  Dale Hunter 11/30/2014, 11:31 AM

## 2014-12-01 ENCOUNTER — Inpatient Hospital Stay (HOSPITAL_COMMUNITY): Payer: Medicaid Other

## 2014-12-01 DIAGNOSIS — J9601 Acute respiratory failure with hypoxia: Secondary | ICD-10-CM | POA: Insufficient documentation

## 2014-12-01 DIAGNOSIS — E876 Hypokalemia: Secondary | ICD-10-CM

## 2014-12-01 DIAGNOSIS — J80 Acute respiratory distress syndrome: Secondary | ICD-10-CM | POA: Insufficient documentation

## 2014-12-01 LAB — POCT I-STAT 3, ART BLOOD GAS (G3+)
ACID-BASE EXCESS: 8 mmol/L — AB (ref 0.0–2.0)
BICARBONATE: 31.4 meq/L — AB (ref 20.0–24.0)
O2 SAT: 100 %
PO2 ART: 250 mmHg — AB (ref 80.0–100.0)
TCO2: 33 mmol/L (ref 0–100)
pCO2 arterial: 41 mmHg (ref 35.0–45.0)
pH, Arterial: 7.495 — ABNORMAL HIGH (ref 7.350–7.450)

## 2014-12-01 LAB — CBC
HCT: 31.1 % — ABNORMAL LOW (ref 39.0–52.0)
Hemoglobin: 10 g/dL — ABNORMAL LOW (ref 13.0–17.0)
MCH: 28.8 pg (ref 26.0–34.0)
MCHC: 32.2 g/dL (ref 30.0–36.0)
MCV: 89.6 fL (ref 78.0–100.0)
PLATELETS: 127 10*3/uL — AB (ref 150–400)
RBC: 3.47 MIL/uL — AB (ref 4.22–5.81)
RDW: 14.6 % (ref 11.5–15.5)
WBC: 5.7 10*3/uL (ref 4.0–10.5)

## 2014-12-01 LAB — COMPREHENSIVE METABOLIC PANEL
ALK PHOS: 63 U/L (ref 38–126)
ALT: 54 U/L (ref 17–63)
ANION GAP: 13 (ref 5–15)
AST: 38 U/L (ref 15–41)
Albumin: 2.2 g/dL — ABNORMAL LOW (ref 3.5–5.0)
BILIRUBIN TOTAL: 0.9 mg/dL (ref 0.3–1.2)
BUN: 11 mg/dL (ref 6–20)
CHLORIDE: 99 mmol/L — AB (ref 101–111)
CO2: 30 mmol/L (ref 22–32)
Calcium: 7.5 mg/dL — ABNORMAL LOW (ref 8.9–10.3)
Creatinine, Ser: 1.14 mg/dL (ref 0.61–1.24)
GFR calc Af Amer: >60 mL/min (ref >60)
GFR calc non Af Amer: >60 mL/min (ref >60)
Glucose, Bld: 116 mg/dL — ABNORMAL HIGH (ref 65–99)
POTASSIUM: 3.2 mmol/L — AB (ref 3.5–5.1)
SODIUM: 142 mmol/L (ref 135–145)
TOTAL PROTEIN: 5.6 g/dL — AB (ref 6.5–8.1)

## 2014-12-01 LAB — GLUCOSE, CAPILLARY
GLUCOSE-CAPILLARY: 104 mg/dL — AB (ref 65–99)
GLUCOSE-CAPILLARY: 157 mg/dL — AB (ref 65–99)
GLUCOSE-CAPILLARY: 209 mg/dL — AB (ref 65–99)
Glucose-Capillary: 124 mg/dL — ABNORMAL HIGH (ref 65–99)
Glucose-Capillary: 125 mg/dL — ABNORMAL HIGH (ref 65–99)
Glucose-Capillary: 209 mg/dL — ABNORMAL HIGH (ref 65–99)
Glucose-Capillary: 218 mg/dL — ABNORMAL HIGH (ref 65–99)

## 2014-12-01 LAB — CK: Total CK: 507 U/L — ABNORMAL HIGH (ref 49–397)

## 2014-12-01 LAB — PROCALCITONIN: PROCALCITONIN: 3.51 ng/mL

## 2014-12-01 LAB — TROPONIN I: Troponin I: 0.03 ng/mL (ref <0.031)

## 2014-12-01 MED ORDER — CHLORHEXIDINE GLUCONATE 0.12 % MT SOLN
15.0000 mL | Freq: Two times a day (BID) | OROMUCOSAL | Status: DC
  Administered 2014-12-02 (×2): 15 mL via OROMUCOSAL
  Filled 2014-12-01 (×2): qty 15

## 2014-12-01 MED ORDER — FENTANYL CITRATE (PF) 100 MCG/2ML IJ SOLN
INTRAMUSCULAR | Status: AC
  Filled 2014-12-01: qty 2

## 2014-12-01 MED ORDER — FENTANYL CITRATE (PF) 100 MCG/2ML IJ SOLN: 100.0000 ug | INTRAMUSCULAR | Status: DC | PRN

## 2014-12-01 MED ORDER — FENTANYL BOLUS VIA INFUSION
50.0000 ug | INTRAVENOUS | Status: DC | PRN
  Administered 2014-12-01 – 2014-12-02 (×2): 50 ug via INTRAVENOUS
  Filled 2014-12-01: qty 50

## 2014-12-01 MED ORDER — FENTANYL CITRATE (PF) 100 MCG/2ML IJ SOLN: 50.0000 ug | Freq: Once | INTRAMUSCULAR | Status: DC

## 2014-12-01 MED ORDER — MIDAZOLAM HCL 2 MG/2ML IJ SOLN
INTRAMUSCULAR | Status: AC
  Filled 2014-12-01: qty 2

## 2014-12-01 MED ORDER — PANTOPRAZOLE SODIUM 40 MG IV SOLR
40.0000 mg | INTRAVENOUS | Status: DC
  Administered 2014-12-01 – 2014-12-05 (×5): 40 mg via INTRAVENOUS
  Filled 2014-12-01 (×8): qty 40

## 2014-12-01 MED ORDER — VITAL AF 1.2 CAL PO LIQD
1000.0000 mL | ORAL | Status: DC
  Administered 2014-12-01: 1000 mL
  Filled 2014-12-01 (×6): qty 1000

## 2014-12-01 MED ORDER — FENTANYL CITRATE (PF) 100 MCG/2ML IJ SOLN
100.0000 ug | Freq: Once | INTRAMUSCULAR | Status: AC
  Administered 2014-12-01: 100 ug via INTRAVENOUS

## 2014-12-01 MED ORDER — POTASSIUM CHLORIDE 20 MEQ/15ML (10%) PO SOLN
40.0000 meq | Freq: Three times a day (TID) | ORAL | Status: AC
  Administered 2014-12-01 (×2): 40 meq
  Filled 2014-12-01 (×3): qty 30

## 2014-12-01 MED ORDER — FENTANYL CITRATE (PF) 100 MCG/2ML IJ SOLN
100.0000 ug | INTRAMUSCULAR | Status: DC | PRN
  Administered 2014-12-01: 50 ug via INTRAVENOUS
  Administered 2014-12-01: 100 ug via INTRAVENOUS
  Filled 2014-12-01 (×2): qty 2

## 2014-12-01 MED ORDER — SODIUM CHLORIDE 0.9 % IV SOLN
25.0000 ug/h | INTRAVENOUS | Status: DC
  Administered 2014-12-01: 100 ug/h via INTRAVENOUS
  Administered 2014-12-02: 200 ug/h via INTRAVENOUS
  Filled 2014-12-01 (×2): qty 50

## 2014-12-01 MED ORDER — ROCURONIUM BROMIDE 50 MG/5ML IV SOLN
50.0000 mg | Freq: Once | INTRAVENOUS | Status: AC
  Administered 2014-12-01: 50 mg via INTRAVENOUS

## 2014-12-01 MED ORDER — MIDAZOLAM HCL 2 MG/2ML IJ SOLN
2.0000 mg | INTRAMUSCULAR | Status: DC | PRN
  Administered 2014-12-01 (×2): 2 mg via INTRAVENOUS
  Filled 2014-12-01 (×2): qty 2

## 2014-12-01 MED ORDER — PRO-STAT SUGAR FREE PO LIQD
30.0000 mL | Freq: Two times a day (BID) | ORAL | Status: AC
  Administered 2014-12-01 (×2): 30 mL
  Filled 2014-12-01 (×2): qty 30

## 2014-12-01 MED ORDER — MAGNESIUM SULFATE 2 GM/50ML IV SOLN
2.0000 g | Freq: Once | INTRAVENOUS | Status: AC
  Administered 2014-12-01: 2 g via INTRAVENOUS
  Filled 2014-12-01: qty 50

## 2014-12-01 MED ORDER — FUROSEMIDE 10 MG/ML IJ SOLN
20.0000 mg | Freq: Three times a day (TID) | INTRAMUSCULAR | Status: AC
  Administered 2014-12-01 (×2): 20 mg via INTRAVENOUS
  Filled 2014-12-01 (×2): qty 2

## 2014-12-01 MED ORDER — MIDAZOLAM HCL 2 MG/2ML IJ SOLN
2.0000 mg | Freq: Once | INTRAMUSCULAR | Status: AC
  Administered 2014-12-01: 2 mg via INTRAVENOUS

## 2014-12-01 MED ORDER — MIDAZOLAM HCL 2 MG/2ML IJ SOLN
2.0000 mg | INTRAMUSCULAR | Status: DC | PRN
  Administered 2014-12-01 – 2014-12-02 (×3): 2 mg via INTRAVENOUS
  Filled 2014-12-01 (×3): qty 2

## 2014-12-01 MED ORDER — ETOMIDATE 2 MG/ML IV SOLN: 20.0000 mg | Freq: Once | INTRAVENOUS | Status: AC

## 2014-12-01 MED ORDER — CETYLPYRIDINIUM CHLORIDE 0.05 % MT LIQD
7.0000 mL | Freq: Four times a day (QID) | OROMUCOSAL | Status: DC
  Administered 2014-12-02 (×3): 7 mL via OROMUCOSAL

## 2014-12-01 NOTE — Progress Notes (Signed)
PULMONARY / CRITICAL CARE MEDICINE   Name: Dale Hunter MRN: [id number] DOB: [birth year]    ADMISSION DATE:  11/15/2014 CONSULTATION DATE:  11/29/2014  REFERRING MD :  Dr. Carles Collet  CHIEF COMPLAINT:  Fever  INITIAL PRESENTATION: 16 schizophrenic M with DM adm to [facility] 5/31 with intentional insulin overdose, he was cleared medically and pending behavioral health placement, when encephalopathy worsened 6/14 and he developed rigidity, fever up to 105F rectally. PCCM called for further eval.    MAJOR EVENTS/TEST RESULTS: 5/31 admitted to [facility] service with intentional insulin OD 6/01 Psych consultation 6/14 High fever, respiratory distress. Transferred to ICU/PCCM service. Concern for NMS. Received dantrolene and bromocriptine. Concern for sepsis. V/Z initiated. CXR c/w pulm edema 6/15 Procalcitonin elevated (7.62). Blood cultures positive for GNRs. Abx adjusted 6/15 TTE:   INDWELLING DEVICES::   MICRO DATA: Urine 5/31 >> NEG 5/31 blood >> NEG MRSA PCR 6/01 >> NEG Urine 6/06 >> NEG C diff 6/14 >> NEG Blood 6/14 >> 2/2 GNRs >>  Urine 6/14 >>    ANTIMICROBIALS:  Vanc 6/14 >> 6/15 Zosyn 6/14 >> 6/15 Cipro 6/15 >>  Ceftaz 6/15 >>   SUBJECTIVE:  Severe respiratory distress, desaturating on 100% NRB.  VITAL SIGNS: Temp:  [98 F (36.7 C)-100.2 F (37.9 C)] 99.2 F (37.3 C) (06/16 0751) Pulse Rate:  [108-119] 115 (06/16 0800) Resp:  [11-26] 20 (06/16 0800) BP: (127-153)/(76-106) 151/89 mmHg (06/16 0800) SpO2:  [85 %-99 %] 92 % (06/16 0800) Weight:  [68.7 kg (151 lb 7.3 oz)] 68.7 kg (151 lb 7.3 oz) (06/16 0514) HEMODYNAMICS:   VENTILATOR SETTINGS:   INTAKE / OUTPUT:  Intake/Output Summary (Last 24 hours) at 12/01/14 0959 Last data filed at 12/01/14 0511  Gross per 24 hour  Intake    450 ml  Output   1525 ml  Net  -1075 ml   PHYSICAL EXAMINATION: General:  Severe respiratory distress. Neuro:  RASS 0. No focal deficits. HEENT:  [place]/AT, PERRL, EOM-I and DMM Cardiovascular:   Tachy, regular, no M/R/G. Lungs: Bibasialr crackles Abdomen:  Soft, non-tender, non-distended Ext: L BKA, no edema, warm. Prevalon boot on R foot  LABS:  CBC  Recent Labs Lab 11/29/14 1600 11/30/14 0447 12/01/14 0455  WBC 5.2 6.5 5.7  HGB 10.0* 11.2* 10.0*  HCT 30.7* 34.0* 31.1*  PLT 129* 128* 127*   Coag's  Recent Labs Lab 11/29/14 1530  APTT 69*  INR 1.37   BMET  Recent Labs Lab 11/29/14 2325 11/30/14 0447 12/01/14 0455  NA 138 139 142  K 4.4 4.4 3.2*  CL 106 103 99*  CO2 21* 26 30  BUN '17 17 11  ' CREATININE 1.51* 1.45* 1.14  GLUCOSE 270* 266* 116*   Electrolytes  Recent Labs Lab 11/29/14 2325 11/30/14 0447 12/01/14 0455  CALCIUM 7.5* 7.7* 7.5*  MG  --  1.6*  --   PHOS  --  3.0  --    Sepsis Markers  Recent Labs Lab 11/29/14 1530 11/29/14 1546 11/29/14 1854 12/01/14 0455  LATICACIDVEN  --  1.5 1.4  --   PROCALCITON 7.62  --   --  3.51   ABG  Recent Labs Lab 11/29/14 1520  PHART 7.414  PCO2ART 30.2*  PO2ART 228*   Liver Enzymes  Recent Labs Lab 11/29/14 2325 12/01/14 0455  AST 78* 38  ALT 77* 54  ALKPHOS 65 63  BILITOT 1.1 0.9  ALBUMIN 2.2* 2.2*   Cardiac Enzymes  Recent Labs Lab 11/29/14 2325 11/30/14 0447 12/01/14 0455  TROPONINI 0.04* 0.04* 0.03   Glucose  Recent Labs Lab 11/30/14 1152 11/30/14 1537 11/30/14 1956 12/01/14 0041 12/01/14 0333 12/01/14 0749  GLUCAP 249* 180* 189* 125* 104* 209*   CXR: NNF (6/14 CXR reviewed - edema pattern)  ASSESSMENT / PLAN:  PULMONARY A:  Hypoxic respiratory failure Pulm edema vs ARDS (favor edema) ETT 6/16>>> P:   Intubate Mechanically ventilate High PEEP as ordered. F/U CXR and ABG. Diureses as ordered.  CARDIOVASCULAR A:  Sinus tachycardia Abnormal EKG - appearance of ant-septal MI (chronic) L IJ TLC 6/16>>> P:  Monitor hemodynamics, rhythm. Check BNP 6/15. TTE ordered 6/15.  RENAL A:  AKI - Cr improving Met acidosis, resolved P:   Monitor  BMET intermittently. Monitor I/Os. Correct electrolytes as indicated.  GASTROINTESTINAL A:   Diarrhea, C diff neg P:   SUP: N/I. Consult nutrition for TF as per nutrition.  HEMATOLOGIC A:   Mild anemia without acute blood loss Mild acute thrombocytopenia P:  DVT px: SCDs. Monitor CBC intermittently. Transfuse per usual guidelines.  INFECTIOUS A:   High fever, resolved GNR bacteremia - unclear source Severe sepsis P:   Monitor temp, WBC count Micro and abx as above  ENDOCRINE A:  DM 1 P:   Cont Levemir Cont SSI - sens scale  NEUROLOGIC A:   Schizophrenia Intentional insulin OD Doubt NMS (alternative explanation for high fever, etc)  Elevated CK noted P:   RASS goal: 0 DC bromocriptine Recheck CK in AM 6/16 Avoid neuroleptics for now Psych following   The patient is critically ill with multiple organ systems failure and requires high complexity decision making for assessment and support, frequent evaluation and titration of therapies, application of advanced monitoring technologies and extensive interpretation of multiple databases.   Critical Care Time devoted to patient care services described in this note is  35  Minutes. This time reflects time of care of this signee Dr Jennet Maduro. This critical care time does not reflect procedure time, or teaching time or supervisory time of PA/NP/Med student/Med Resident etc but could involve care discussion time.  Rush Farmer, M.D. [facility] Pulmonary/Critical Care Medicine. Pager: [phone]. After hours pager: [phone].

## 2014-12-01 NOTE — Procedures (Signed)
Intubation Procedure Note Benhard Alpizar [id number] 1965-10-21  Procedure: Intubation Indications: Airway protection and maintenance  Procedure Details Consent: Unable to obtain consent because of emergent medical necessity. Time Out: Verified patient identification, verified procedure, site/side was marked, verified correct patient position, special equipment/implants available, medications/allergies/relevent history reviewed, required imaging and test results available.  Performed  Maximum sterile technique was used including gloves, hand hygiene and mask.  4 glidescope     Evaluation Hemodynamic Status: BP stable throughout; O2 sats: transiently fell during during procedure Patient's Current Condition: stable Complications: No apparent complications Patient did tolerate procedure well. Chest X-ray ordered to verify placement.  CXR: pending.   Dirk Dress, NP 12/01/2014  9:47 AM  I was present and supervised the entire procedure.  Alyson Reedy, M.D. [facility] Pulmonary/Critical Care Medicine. Pager: [phone]. After hours pager: [phone].

## 2014-12-01 NOTE — Procedures (Signed)
Central Venous Catheter Insertion Procedure Note Dale Hunter [id number] [birth year]  Procedure: Insertion of Central Venous Catheter Indications: Assessment of intravascular volume, Drug and/or fluid administration and Frequent blood sampling  Procedure Details Consent: Risks of procedure as well as the alternatives and risks of each were explained to the (patient/caregiver).  Consent for procedure obtained. Time Out: Verified patient identification, verified procedure, site/side was marked, verified correct patient position, special equipment/implants available, medications/allergies/relevent history reviewed, required imaging and test results available.  Performed  Maximum sterile technique was used including antiseptics, cap, gloves, gown, hand hygiene, mask and sheet. Skin prep: Chlorhexidine; local anesthetic administered A antimicrobial bonded/coated triple lumen catheter was placed in the left internal jugular vein using the Seldinger technique.  Evaluation Blood flow good Complications: No apparent complications Patient did tolerate procedure well. Chest X-ray ordered to verify placement.  CXR: pending.  U/S used in placement.  Dale Hunter 12/01/2014, 10:11 AM

## 2014-12-01 NOTE — Procedures (Signed)
Intubation Procedure Note Dale Hunter [id number] 1966/04/24  Procedure: Intubation Indications: Respiratory insufficiency  Procedure Details Consent: Unable to obtain consent because of emergent medical necessity.   Time Out: Verified patient identification, verified procedure, site/side was marked, verified correct patient position, special equipment/implants available, medications/allergies/relevent history reviewed, required imaging and test results available.  Performed  Maximum sterile technique was used including gloves, gown, hand hygiene and mask.  4    Evaluation Hemodynamic Status: BP stable throughout; O2 sats: transiently fell during during procedure Patient's Current Condition: stable Complications: Complications of desaturation Patient did tolerate procedure well. Chest X-ray ordered to verify placement.  CXR: pending.  Patient intubated due to low SpO2 on NRB mask X2days. Patient desaturation during procedure, manually ventilation with PEEP valve attached at 10. Patient had oral airway place to aid ventilation. Pt intubated with 8.0 ETT, secured at 23lips. Patient has bilateral breath sounds, positive color change on ETCO2 detector, CXR pending. Patient is currently stable at this time. RT will continue to monitor.    Carolan Shiver 12/01/2014

## 2014-12-01 NOTE — Progress Notes (Signed)
Subjective: Cooperation has been an issue, states he is having trouble moving.   Exam: Filed Vitals:   12/01/14 0800  BP: 151/89  Pulse: 115  Temp:   Resp: 20   Gen: In bed, NAD Abd: soft.  MS: Awake, able to follow simple commands, but does not cooperate well.  GL:OVFI, blinks to threat bilaerally.  Motor: continued mild increase in tone, though improved. He is able to make fist when distracted, but does nto do so to command.  Sensory:responds to nox stim x 4.    Impression: 49 yo M with worsesned altered mental status, severe hyperthermia to 105, increased tone (though milder than is usually expected with NMS), tachycardia and elevated CK in the setting of antipsychotic use. He has defervesced nicely.   With this constellation of symptoms, I continue to suspect that this does indeed represent NMS. I would favor continued bromocriptine x 7 days.    Recommendations: 1) Continue bromocriptine for total 7 days.  2) hold antipsychotics.  3) appreciate psychiatry assistance.  4) Neurology to sign off at this time. Please call with any further questions or concerns.   Ritta Slot, MD [facility] [phone]  If 7pm- 7am, please page neurology on call as listed in AMION.

## 2014-12-01 NOTE — Progress Notes (Signed)
Initial Nutrition Assessment   INTERVENTION:   Initiate TF via OGT with Vital AF 1.2 at 25 ml/h and Prostat 30 ml BID on day 1; on day 2, d/c Prostat and increase to goal rate of 70 ml/h (1680 ml per day) to provide 2016 kcals, 126 gm protein, 1362 ml free water daily.  NUTRITION DIAGNOSIS:  Inadequate oral intake related to inability to eat as evidenced by NPO status.  GOAL:  Patient will meet greater than or equal to 90% of their needs  MONITOR:  TF tolerance, Weight trends, Labs, Vent status  REASON FOR ASSESSMENT:  Consult Enteral/tube feeding initiation and management  ASSESSMENT:  Patient admitted on 5/31 with intentional insulin overdose. Transferred to the ICU on 6/14 with respiratory distress and high fever. CXR showed pulmonary edema. Required intubation 6/16.  Labs reviewed: potassium is low, receiving IV replacement. Nutrition focused physical exam completed.  No muscle or subcutaneous fat depletion noticed. Prior to intubation, meal intake was variable, 0-100% meal completion documented.  Patient is currently intubated on ventilator support MV: 12 L/min Temp (24hrs), Avg:99.2 F (37.3 C), Min:98 F (36.7 C), Max:100.2 F (37.9 C)  Propofol: none   Height:  Ht Readings from Last 1 Encounters:  11/16/14 5\' 10"  (1.778 m)    Weight:  Wt Readings from Last 1 Encounters:  12/01/14 151 lb 7.3 oz (68.7 kg)    Ideal Body Weight:  75.5 kg  Wt Readings from Last 10 Encounters:  12/01/14 151 lb 7.3 oz (68.7 kg)    BMI:  Body mass index is 21.73 kg/(m^2).  Estimated Nutritional Needs:  Kcal:  1998  Protein:  100-115 gm  Fluid:  2 L  Skin:  Reviewed, no issues  Diet Order:   NPO  EDUCATION NEEDS:  No education needs identified at this time   Intake/Output Summary (Last 24 hours) at 12/01/14 1121 Last data filed at 12/01/14 0511  Gross per 24 hour  Intake    420 ml  Output   1525 ml  Net  -1105 ml    Last BM:  6/15   Joaquin Courts, RD, LDN, CNSC Pager [phone] After Hours Pager [phone]

## 2014-12-01 NOTE — Progress Notes (Signed)
eLink Physician-Brief Progress Note Patient Name: Dale Hunter DOB: 1965-08-28 MRN: [id number]   Date of Service  12/01/2014  HPI/Events of Note  Notified of need for stress ulcer prophylaxis. Patient is intubated and mechanically ventilated.   eICU Interventions  Will order Protonix IV.     Intervention Category Intermediate Interventions: Best-practice therapies (e.g. DVT, beta blocker, etc.)  Sommer,Steven Eugene 12/01/2014, 10:45 PM
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
## 2014-12-01 NOTE — Clinical Social Work Psych Note (Signed)
Covering Psych CSW confirmed with [facility], patient remains on [facility] waitlist.  Dale Hunter, [place] - [phone] Clinical Social Work Department Orthopedics [phone]) and Surgical [phone])

## 2014-12-02 ENCOUNTER — Inpatient Hospital Stay (HOSPITAL_COMMUNITY): Payer: Medicaid Other

## 2014-12-02 DIAGNOSIS — J9601 Acute respiratory failure with hypoxia: Secondary | ICD-10-CM

## 2014-12-02 DIAGNOSIS — R6520 Severe sepsis without septic shock: Secondary | ICD-10-CM

## 2014-12-02 DIAGNOSIS — J80 Acute respiratory distress syndrome: Secondary | ICD-10-CM

## 2014-12-02 DIAGNOSIS — A419 Sepsis, unspecified organism: Secondary | ICD-10-CM

## 2014-12-02 LAB — CBC
HEMATOCRIT: 28.2 % — AB (ref 39.0–52.0)
Hemoglobin: 9 g/dL — ABNORMAL LOW (ref 13.0–17.0)
MCH: 28.9 pg (ref 26.0–34.0)
MCHC: 31.9 g/dL (ref 30.0–36.0)
MCV: 90.7 fL (ref 78.0–100.0)
Platelets: 134 10*3/uL — ABNORMAL LOW (ref 150–400)
RBC: 3.11 MIL/uL — ABNORMAL LOW (ref 4.22–5.81)
RDW: 14.7 % (ref 11.5–15.5)
WBC: 6.2 10*3/uL (ref 4.0–10.5)

## 2014-12-02 LAB — BLOOD GAS, ARTERIAL
Acid-Base Excess: 8.9 mmol/L — ABNORMAL HIGH (ref 0.0–2.0)
Bicarbonate: 32.4 mEq/L — ABNORMAL HIGH (ref 20.0–24.0)
Drawn by: 418751
FIO2: 0.6 %
LHR: 12 {breaths}/min
O2 Saturation: 99.7 %
PCO2 ART: 40.7 mmHg (ref 35.0–45.0)
PEEP: 14 cmH2O
PH ART: 7.512 — AB (ref 7.350–7.450)
Patient temperature: 98.6
TCO2: 33.7 mmol/L (ref 0–100)
VT: 500 mL
pO2, Arterial: 238 mmHg — ABNORMAL HIGH (ref 80.0–100.0)

## 2014-12-02 LAB — CULTURE, BLOOD (ROUTINE X 2)

## 2014-12-02 LAB — BASIC METABOLIC PANEL
ANION GAP: 8 (ref 5–15)
BUN: 17 mg/dL (ref 6–20)
CHLORIDE: 100 mmol/L — AB (ref 101–111)
CO2: 34 mmol/L — AB (ref 22–32)
Calcium: 7.7 mg/dL — ABNORMAL LOW (ref 8.9–10.3)
Creatinine, Ser: 1.23 mg/dL (ref 0.61–1.24)
GFR calc Af Amer: >60 mL/min (ref >60)
GFR calc non Af Amer: >60 mL/min (ref >60)
GLUCOSE: 223 mg/dL — AB (ref 65–99)
POTASSIUM: 3.1 mmol/L — AB (ref 3.5–5.1)
SODIUM: 142 mmol/L (ref 135–145)

## 2014-12-02 LAB — GLUCOSE, CAPILLARY
GLUCOSE-CAPILLARY: 227 mg/dL — AB (ref 65–99)
GLUCOSE-CAPILLARY: 254 mg/dL — AB (ref 65–99)
Glucose-Capillary: 160 mg/dL — ABNORMAL HIGH (ref 65–99)
Glucose-Capillary: 297 mg/dL — ABNORMAL HIGH (ref 65–99)
Glucose-Capillary: 128 mg/dL — ABNORMAL HIGH (ref 65–99)

## 2014-12-02 LAB — MAGNESIUM: Magnesium: 1.9 mg/dL (ref 1.7–2.4)

## 2014-12-02 LAB — PHOSPHORUS: Phosphorus: <1 mg/dL — CL (ref 2.5–4.6)

## 2014-12-02 MED ORDER — CHLORHEXIDINE GLUCONATE 0.12 % MT SOLN: 15.0000 mL | Freq: Two times a day (BID) | OROMUCOSAL | Status: DC

## 2014-12-02 MED ORDER — POTASSIUM CHLORIDE 20 MEQ/15ML (10%) PO SOLN
ORAL | Status: AC
  Filled 2014-12-02: qty 30

## 2014-12-02 MED ORDER — POTASSIUM PHOSPHATES 15 MMOLE/5ML IV SOLN
30.0000 mmol | Freq: Once | INTRAVENOUS | Status: AC
  Administered 2014-12-02: 30 mmol via INTRAVENOUS
  Filled 2014-12-02: qty 10

## 2014-12-02 MED ORDER — CEFTRIAXONE SODIUM IN DEXTROSE 20 MG/ML IV SOLN
1.0000 g | INTRAVENOUS | Status: DC
  Administered 2014-12-02 – 2014-12-06 (×5): 1 g via INTRAVENOUS
  Filled 2014-12-02 (×6): qty 50

## 2014-12-02 MED ORDER — CETYLPYRIDINIUM CHLORIDE 0.05 % MT LIQD: 7.0000 mL | Freq: Two times a day (BID) | OROMUCOSAL | Status: DC

## 2014-12-02 MED ORDER — ENOXAPARIN SODIUM 40 MG/0.4ML ~~LOC~~ SOLN
40.0000 mg | SUBCUTANEOUS | Status: DC
  Administered 2014-12-02 – 2014-12-06 (×4): 40 mg via SUBCUTANEOUS
  Filled 2014-12-02 (×6): qty 0.4

## 2014-12-02 MED ORDER — POTASSIUM CHLORIDE 20 MEQ/15ML (10%) PO SOLN
40.0000 meq | Freq: Once | ORAL | Status: AC
  Administered 2014-12-02: 40 meq

## 2014-12-02 NOTE — Progress Notes (Signed)
PULMONARY / CRITICAL CARE MEDICINE   Name: Dale Hunter MRN: [id number] DOB: 02-29-1966    ADMISSION DATE:  11/15/2014 CONSULTATION DATE:  11/29/2014  REFERRING MD :  Dr. Carles Collet  CHIEF COMPLAINT:  Fever  INITIAL PRESENTATION: 62 schizophrenic M with DM adm to [facility] 5/31 with intentional insulin overdose, he was cleared medically and pending behavioral health placement, when encephalopathy worsened 6/14 and he developed rigidity, fever up to 105F rectally. PCCM called for further eval.    MAJOR EVENTS/TEST RESULTS: 5/31 admitted to [facility] service with intentional insulin OD 6/01 Psych consultation. recommended inpt behavioral health 6/14 High fever, respiratory distress. Transferred to ICU/PCCM service. Concern for NMS. Received dantrolene and bromocriptine. Concern for sepsis. V/Z initiated. CXR c/w pulm edema vs ARDS 6/15 Procalcitonin elevated (7.62). Blood cultures positive for GNRs. Abx adjusted 6/15 TTE: LVEF 55%. Mild hypertrophy 6/16 Persistent severe bilateral AS dz on CXR. Respiratory distress. Intubated.  6/17 CXR markedly improved. Passed SBT and extubated  INDWELLING DEVICES:: ETT 6/16 >> 6/17 L IJ CVL 6/16 >>   MICRO DATA: Urine 5/31 >> NEG 5/31 blood >> NEG MRSA PCR 6/01 >> NEG Urine 6/06 >> NEG C diff 6/14 >> NEG Urine 6/14 >> NEG Blood 6/14 >> 2/2 serratia    ANTIMICROBIALS:  Vanc 6/14 >> 6/15 Zosyn 6/14 >> 6/15 Cipro 6/15 >> 6/17 Ceftaz 6/15 >> 6/17 Ceftriaxone 6/17 >>   SUBJECTIVE:  RASS -2 - improved after DC of continuous fentanyl. + F/C.   VITAL SIGNS: Temp:  [98 F (36.7 C)-99.9 F (37.7 C)] 98 F (36.7 C) (06/17 0756) Pulse Rate:  [88-108] 105 (06/17 1300) Resp:  [12-25] 25 (06/17 1300) BP: (91-149)/(59-92) 141/83 mmHg (06/17 1300) SpO2:  [96 %-100 %] 100 % (06/17 1300) FiO2 (%):  [40 %-100 %] 100 % (06/17 1300) Weight:  [68.5 kg (151 lb 0.2 oz)] 68.5 kg (151 lb 0.2 oz) (06/17 0400) HEMODYNAMICS: CVP:  [5 mmHg-12 mmHg] 10 mmHg VENTILATOR  SETTINGS: Vent Mode:  [-] PSV;CPAP FiO2 (%):  [40 %-100 %] 100 % Set Rate:  [12 bmp-13 bmp] 13 bmp Vt Set:  [580 mL] 580 mL PEEP:  [5 cmH20-14 cmH20] 5 cmH20 Pressure Support:  [5 cmH20] 5 cmH20 Plateau Pressure:  [29 cmH20-31 cmH20] 29 cmH20 INTAKE / OUTPUT:  Intake/Output Summary (Last 24 hours) at 12/02/14 1322 Last data filed at 12/02/14 1300  Gross per 24 hour  Intake 2008.26 ml  Output   2270 ml  Net -261.74 ml   PHYSICAL EXAMINATION: General:  NAD Neuro: No focal deficits. HEENT:  /AT Cardiovascular: Reg, no M Lungs:clear Abdomen:  Soft, non-tender, +BS Ext: L BKA, no edema, warm. Prevalon boot on R foot  LABS:  CBC  Recent Labs Lab 11/30/14 0447 12/01/14 0455 12/02/14 0400  WBC 6.5 5.7 6.2  HGB 11.2* 10.0* 9.0*  HCT 34.0* 31.1* 28.2*  PLT 128* 127* 134*   Coag's  Recent Labs Lab 11/29/14 1530  APTT 69*  INR 1.37   BMET  Recent Labs Lab 11/30/14 0447 12/01/14 0455 12/02/14 0400  NA 139 142 142  K 4.4 3.2* 3.1*  CL 103 99* 100*  CO2 26 30 34*  BUN '17 11 17  ' CREATININE 1.45* 1.14 1.23  GLUCOSE 266* 116* 223*   Electrolytes  Recent Labs Lab 11/30/14 0447 12/01/14 0455 12/02/14 0400  CALCIUM 7.7* 7.5* 7.7*  MG 1.6*  --  1.9  PHOS 3.0  --  <1.0*   Sepsis Markers  Recent Labs Lab 11/29/14 1530 11/29/14 1546 11/29/14  1854 12/01/14 0455  LATICACIDVEN  --  1.5 1.4  --   PROCALCITON 7.62  --   --  3.51   ABG  Recent Labs Lab 11/29/14 1520 12/01/14 1149 12/02/14 0349  PHART 7.414 7.495* 7.512*  PCO2ART 30.2* 41.0 40.7  PO2ART 228* 250.0* 238*   Liver Enzymes  Recent Labs Lab 11/29/14 2325 12/01/14 0455  AST 78* 38  ALT 77* 54  ALKPHOS 65 63  BILITOT 1.1 0.9  ALBUMIN 2.2* 2.2*   Cardiac Enzymes  Recent Labs Lab 11/29/14 2325 11/30/14 0447 12/01/14 0455  TROPONINI 0.04* 0.04* 0.03   Glucose  Recent Labs Lab 12/01/14 1549 12/01/14 1952 12/01/14 2322 12/02/14 0302 12/02/14 0739 12/02/14 1141   GLUCAP 157* 209* 124* 160* 227* 297*   CXR: Markedly improved bilateral infltrates  ASSESSMENT / PLAN:  PULMONARY A:  Hypoxic respiratory failure  Pulm edema vs ARDS - markedly better CXR 6/17 P:   Extubation ordered 6/17 Supplemental O2 to maintain SpO2 > 92% Monitor in ICU post extubation  CARDIOVASCULAR A:  Sinus tachycardia, mild Abnormal EKG  P:  Monitor hemodynamics, rhythm  RENAL A:  AKI - Cr improved Met acidosis, resolved P:   Monitor BMET intermittently. Monitor I/Os. Correct electrolytes as indicated.  GASTROINTESTINAL A:   Diarrhea, C diff neg P:   SUP: N/I. Consult nutrition for TF as per nutrition.  HEMATOLOGIC A:   Mild anemia without acute blood loss Mild acute thrombocytopenia P:  DVT px: SCDs. Monitor CBC intermittently. Transfuse per usual guidelines.  INFECTIOUS A:   High fever, resolved Serratia bacteremia - unclear source Severe sepsis, resolving P:   Monitor temp, WBC count Micro and abx as above  ENDOCRINE A:  DM 1 P:   Cont Levemir Cont SSI - sens scale  Change to ACHS once diet resumed  NEUROLOGIC A:   Schizophrenia Intentional insulin OD 5/31 Doubt NMS - Ck improving P:   RASS goal: 0 Avoiding neuroleptics for now Psych following   CCM X 35 mins  Merton Border, MD ; [facility] service Mobile [phone].  After 5:30 PM or weekends, call [phone]

## 2014-12-02 NOTE — Procedures (Signed)
Extubation Procedure Note  Patient Details:   Name: Dale Hunter DOB: May 14, 1966 MRN: [id number]   Airway Documentation: Pt was extubated to 4 liters [place] and sat dropped down to 84%. He was then put on a NRB at 15 liters per min and now has a sat of 100%. Patient did have an audible cuff leak before procedure and is currently able to speak.    Evaluation  O2 sats: transiently fell during during procedure Complications: Complications of patient sat dropped down to 84% on 4 liters nasal cannula  Patient did tolerate procedure well. Bilateral Breath Sounds: Clear, Diminished Suctioning: Oral, Airway Yes  Lowell Mcgurk D Emmerson Taddei 12/02/2014, 1:00 PM

## 2014-12-02 NOTE — Clinical Social Work Note (Signed)
[facility] contacted CSW stating patient able to admit if medically stable. CSW updated [facility] with patient's current medical condition. Per RN, patient NOT medically stable for discharge.  CSW to continue to follow and assist with discharge disposition to [facility] once patient becomes more medically stable.  Marcelline Deist, [place] - [phone] Clinical Social Work Department Orthopedics [phone]) and Surgical [phone])

## 2014-12-02 NOTE — Progress Notes (Signed)
Critical value: phosphorus <1.0 reported to [facility] MD at 0522.

## 2014-12-02 NOTE — Progress Notes (Signed)
eLink Physician-Brief Progress Note Patient Name: Dale Hunter DOB: [birth year] MRN: [id number]   Date of Service  12/02/2014  HPI/Events of Note  k phos  eICU Interventions       Intervention Category Intermediate Interventions: Electrolyte abnormality - evaluation and management  Nelda Bucks. 12/02/2014, 6:30 AM

## 2014-12-02 NOTE — Progress Notes (Signed)
Inpatient Diabetes Program Recommendations  AACE/ADA: New Consensus Statement on Inpatient Glycemic Control (2013)  Target Ranges:  Prepandial:   less than 140 mg/dL      Peak postprandial:   less than 180 mg/dL (1-2 hours)      Critically ill patients:  140 - 180 mg/dL   Results for Dale Hunter, Dale Hunter (MRN [id number]) as of 12/02/2014 09:40  Ref. Range 12/01/2014 07:49 12/01/2014 11:21 12/01/2014 15:49 12/01/2014 19:52 12/01/2014 23:22 12/02/2014 03:02 12/02/2014 07:39  Glucose-Capillary Latest Ref Range: 65-99 mg/dL 518 (H) 335 (H) 825 (H) 209 (H) 124 (H) 160 (H) 227 (H)   Current orders for Inpatient glycemic control: Levemir 6 units daily, Novolog 0-9 units Q4H  Inpatient Diabetes Program Recommendations Insulin - Basal: Please consider incresing Levemir to 8 units daily. Correction (SSI): Since patient is intubated and on tube feedings recommend discontinuing regular glycemic control order set and order the ICU Glycemic Control order set. Insulin - Tube Feeding Coverage: Please consider ordering Novolog 3 units Q4H for tube feeding coverage.   Thanks, Orlando Penner, RN, MSN, CCRN, CDE Diabetes Coordinator Inpatient Diabetes Program [phone] (Team Pager from 8am to 5pm) [phone] ([facility] office) [phone] [facility] office) [phone] [facility] office)

## 2014-12-02 NOTE — Progress Notes (Signed)
200cc of fentanyl drip wasted in sink with Janice Coffin RN.

## 2014-12-03 ENCOUNTER — Inpatient Hospital Stay (HOSPITAL_COMMUNITY): Payer: Medicaid Other

## 2014-12-03 LAB — CBC
HEMATOCRIT: 27.2 % — AB (ref 39.0–52.0)
HEMOGLOBIN: 8.9 g/dL — AB (ref 13.0–17.0)
MCH: 29.7 pg (ref 26.0–34.0)
MCHC: 32.7 g/dL (ref 30.0–36.0)
MCV: 90.7 fL (ref 78.0–100.0)
Platelets: 167 10*3/uL (ref 150–400)
RBC: 3 MIL/uL — ABNORMAL LOW (ref 4.22–5.81)
RDW: 14.9 % (ref 11.5–15.5)
WBC: 7.1 10*3/uL (ref 4.0–10.5)

## 2014-12-03 LAB — GLUCOSE, CAPILLARY
GLUCOSE-CAPILLARY: 160 mg/dL — AB (ref 65–99)
GLUCOSE-CAPILLARY: 202 mg/dL — AB (ref 65–99)
Glucose-Capillary: 128 mg/dL — ABNORMAL HIGH (ref 65–99)
Glucose-Capillary: 182 mg/dL — ABNORMAL HIGH (ref 65–99)
Glucose-Capillary: 282 mg/dL — ABNORMAL HIGH (ref 65–99)
Glucose-Capillary: 319 mg/dL — ABNORMAL HIGH (ref 65–99)

## 2014-12-03 LAB — COMPREHENSIVE METABOLIC PANEL
ALBUMIN: 1.8 g/dL — AB (ref 3.5–5.0)
ALT: 30 U/L (ref 17–63)
AST: 20 U/L (ref 15–41)
Alkaline Phosphatase: 72 U/L (ref 38–126)
Anion gap: 11 (ref 5–15)
BILIRUBIN TOTAL: 0.8 mg/dL (ref 0.3–1.2)
BUN: 12 mg/dL (ref 6–20)
CHLORIDE: 101 mmol/L (ref 101–111)
CO2: 31 mmol/L (ref 22–32)
Calcium: 7.7 mg/dL — ABNORMAL LOW (ref 8.9–10.3)
Creatinine, Ser: 1.05 mg/dL (ref 0.61–1.24)
GFR calc Af Amer: >60 mL/min (ref >60)
GFR calc non Af Amer: >60 mL/min (ref >60)
Glucose, Bld: 165 mg/dL — ABNORMAL HIGH (ref 65–99)
Potassium: 3.3 mmol/L — ABNORMAL LOW (ref 3.5–5.1)
Sodium: 143 mmol/L (ref 135–145)
Total Protein: 5.3 g/dL — ABNORMAL LOW (ref 6.5–8.1)

## 2014-12-03 LAB — PROCALCITONIN: Procalcitonin: 0.84 ng/mL

## 2014-12-03 MED ORDER — MEMANTINE HCL 10 MG PO TABS
10.0000 mg | ORAL_TABLET | Freq: Two times a day (BID) | ORAL | Status: DC
Start: 2014-12-03 — End: 2014-12-07
  Administered 2014-12-03 – 2014-12-07 (×6): 10 mg via ORAL
  Filled 2014-12-03 (×10): qty 1

## 2014-12-03 MED ORDER — DONEPEZIL HCL 10 MG PO TABS
10.0000 mg | ORAL_TABLET | Freq: Every day | ORAL | Status: DC
Start: 2014-12-03 — End: 2014-12-07
  Administered 2014-12-03 – 2014-12-04 (×2): 10 mg via ORAL
  Filled 2014-12-03 (×5): qty 1

## 2014-12-03 MED ORDER — POTASSIUM CHLORIDE 10 MEQ/50ML IV SOLN
10.0000 meq | INTRAVENOUS | Status: AC
  Administered 2014-12-03 (×6): 10 meq via INTRAVENOUS
  Filled 2014-12-03 (×6): qty 50

## 2014-12-03 MED ORDER — ASENAPINE MALEATE 5 MG SL SUBL
10.0000 mg | SUBLINGUAL_TABLET | Freq: Every day | SUBLINGUAL | Status: DC
  Administered 2014-12-04 – 2014-12-07 (×4): 10 mg via SUBLINGUAL
  Filled 2014-12-03 (×5): qty 2

## 2014-12-03 NOTE — Progress Notes (Signed)
eLink Physician-Brief Progress Note Patient Name: Dale Hunter DOB: 02-07-1966 MRN: [id number]   Date of Service  12/03/2014  HPI/Events of Note  Hypokalemia  eICU Interventions  Potassium replaced     Intervention Category Intermediate Interventions: Electrolyte abnormality - evaluation and management  Castin Donaghue 12/03/2014, 5:09 AM

## 2014-12-03 NOTE — Progress Notes (Signed)
PT Cancellation Note  Patient Details Name: Dale Hunter MRN: [id number] DOB: 1966-05-06   Cancelled Treatment:    Reason Eval/Treat Not Completed: Medical issues which prohibited therapy. Spoke with RN, pt desaturates while on venti-mask with any movement, has been agitated and refusing treatments/medicines, and having some paranoid thoughts. Agree with RN, pt is currently not appropriate for PT. Will follow and proceed with evaluation when medically appropriate.   Mahi Zabriskie 12/03/2014, 12:03 PM  Pager [phone]

## 2014-12-03 NOTE — Progress Notes (Signed)
PULMONARY / CRITICAL CARE MEDICINE   Name: Dale Hunter MRN: [id number] DOB: 12-Mar-1966    ADMISSION DATE:  11/15/2014 CONSULTATION DATE:  11/29/2014  REFERRING MD :  Dr. Carles Collet  CHIEF COMPLAINT:  Fever  INITIAL PRESENTATION: 82 schizophrenic M with DM admitted to [facility] 5/31 with intentional insulin overdose, he was cleared medically and pending behavioral health placement, when encephalopathy worsened 6/14 and he developed rigidity, fever up to 105F rectally. PCCM called for further eval.    MAJOR EVENTS/TEST RESULTS: 5/31  admitted to [facility] service with intentional insulin OD 6/01  Psych consultation. recommended inpt behavioral health 6/14  High fever, respiratory distress. Transferred to ICU/PCCM service. Concern for NMS. Received dantrolene and bromocriptine. Concern for sepsis. V/Z initiated. CXR c/w pulm edema vs ARDS 6/15  Procalcitonin elevated (7.62). Blood cultures positive for GNRs. Abx adjusted 6/15  TTE: LVEF 55%. Mild hypertrophy 6/16  Persistent severe bilateral AS dz on CXR. Respiratory distress. Intubated.  6/17  CXR markedly improved. Passed SBT and extubated 6/18  Confusion, agitation.  Home meds restarted   INDWELLING DEVICES:: ETT 6/16 >> 6/17 L IJ CVL 6/16 >>   MICRO DATA: Urine 5/31 >> NEG 5/31 blood >> NEG MRSA PCR 6/01 >> NEG Urine 6/06 >> NEG C diff 6/14 >> NEG Urine 6/14 >> NEG Blood 6/14 >> 2/2 serratia  ANTIMICROBIALS:  Vanc 6/14 >> 6/15 Zosyn 6/14 >> 6/15 Cipro 6/15 >> 6/17 Ceftaz 6/15 >> 6/17 Ceftriaxone 6/17 >>   SUBJECTIVE:  RN reports pt with intermittent agitation.  No acute events overnight.  Net neg 1L    VITAL SIGNS: Temp:  [98 F (36.7 C)-101.3 F (38.5 C)] 101.3 F (38.5 C) (06/18 0344) Pulse Rate:  [88-112] 109 (06/18 0600) Resp:  [12-30] 26 (06/18 0600) BP: (113-168)/(66-91) 136/76 mmHg (06/18 0600) SpO2:  [91 %-100 %] 93 % (06/18 0600) FiO2 (%):  [40 %-100 %] 50 % (06/18 0400) Weight:  [149 lb 11.1 oz (67.9 kg)] 149 lb 11.1 oz  (67.9 kg) (06/18 0500)   HEMODYNAMICS:     VENTILATOR SETTINGS: Vent Mode:  [-] PSV;CPAP FiO2 (%):  [40 %-100 %] 50 % Set Rate:  [13 bmp] 13 bmp Vt Set:  [580 mL] 580 mL PEEP:  [5 cmH20-12 cmH20] 5 cmH20 Pressure Support:  [5 cmH20] 5 cmH20 Plateau Pressure:  [29 cmH20] 29 cmH20   INTAKE / OUTPUT:  Intake/Output Summary (Last 24 hours) at 12/03/14 0735 Last data filed at 12/03/14 0600  Gross per 24 hour  Intake    925 ml  Output    485 ml  Net    440 ml   PHYSICAL EXAMINATION: General:  Disheveled adult male in NAD Neuro: No focal deficits, MAE, speech clear but not oriented HEENT:  [place]/AT Cardiovascular: Reg, no M Lungs: even/non-labored, lungs bilaterally clear  Abdomen:  Soft, non-tender, +BS Ext: L BKA, no edema, warm. Prevalon boot on R foot  LABS:  CBC  Recent Labs Lab 12/01/14 0455 12/02/14 0400 12/03/14 0355  WBC 5.7 6.2 7.1  HGB 10.0* 9.0* 8.9*  HCT 31.1* 28.2* 27.2*  PLT 127* 134* 167   Coag's  Recent Labs Lab 11/29/14 1530  APTT 69*  INR 1.37   BMET  Recent Labs Lab 12/01/14 0455 12/02/14 0400 12/03/14 0355  NA 142 142 143  K 3.2* 3.1* 3.3*  CL 99* 100* 101  CO2 30 34* 31  BUN '11 17 12  ' CREATININE 1.14 1.23 1.05  GLUCOSE 116* 223* 165*   Electrolytes  Recent  Labs Lab 11/30/14 0447 12/01/14 0455 12/02/14 0400 12/03/14 0355  CALCIUM 7.7* 7.5* 7.7* 7.7*  MG 1.6*  --  1.9  --   PHOS 3.0  --  <1.0*  --    Sepsis Markers  Recent Labs Lab 11/29/14 1530 11/29/14 1546 11/29/14 1854 12/01/14 0455 12/03/14 0355  LATICACIDVEN  --  1.5 1.4  --   --   PROCALCITON 7.62  --   --  3.51 0.84   ABG  Recent Labs Lab 11/29/14 1520 12/01/14 1149 12/02/14 0349  PHART 7.414 7.495* 7.512*  PCO2ART 30.2* 41.0 40.7  PO2ART 228* 250.0* 238*   Liver Enzymes  Recent Labs Lab 11/29/14 2325 12/01/14 0455 12/03/14 0355  AST 78* 38 20  ALT 77* 54 30  ALKPHOS 65 63 72  BILITOT 1.1 0.9 0.8  ALBUMIN 2.2* 2.2* 1.8*   Cardiac  Enzymes  Recent Labs Lab 11/29/14 2325 11/30/14 0447 12/01/14 0455  TROPONINI 0.04* 0.04* 0.03   Glucose  Recent Labs Lab 12/02/14 0739 12/02/14 1141 12/02/14 1525 12/02/14 1922 12/02/14 2358 12/03/14 0321  GLUCAP 227* 297* 254* 128* 128* 160*   CXR: 6/18  Worsening of R sided airspace disease, clearing of L   ASSESSMENT / PLAN:  PULMONARY A:  Hypoxic respiratory failure - extubated 6/17 Pulm edema vs ARDS - CXR improved 6/17 P:   Supplemental O2 to maintain SpO2 > 92% Pulmonary hygiene as able Intermittent CXR  CARDIOVASCULAR A:  Sinus tachycardia, mild Abnormal EKG  P:  Monitor hemodynamics, rhythm  RENAL A:  AKI - Cr improved Hypokalemia  Met acidosis, resolved P:   Monitor BMET intermittently. Monitor I/Os. Correct electrolytes as indicated.  GASTROINTESTINAL A:   Diarrhea, C diff neg Protein Calorie Malnutrition  P:   SUP: N/I. SLP for swallowing eval  HEMATOLOGIC A:   Mild anemia without acute blood loss Mild acute thrombocytopenia P:  DVT px: SCDs. Monitor CBC intermittently. Transfuse per ICU guidelines.  INFECTIOUS A:   Fever, resolved Serratia bacteremia - unclear source Severe sepsis, resolving P:   Monitor temp, WBC count Micro and abx as above  ENDOCRINE A:   DM 1 P:   Cont Levemir 6 units Cont SSI - sens scale.  Change to ACHS once diet resumed  NEUROLOGIC A:   Schizophrenia Intentional insulin OD 5/31 Doubt NMS - Ck improving P:   RASS goal: 0 Resume namenda, aricept, saphris  Psych following    GLOBAL:  Monitor in ICU overnight with O2 needs and agitation.  Resume home meds.  Push PT. Likely out to SDU in am.    Noe Gens, NP-C [place] Pgr: [phone] or [phone]

## 2014-12-04 LAB — GLUCOSE, CAPILLARY
GLUCOSE-CAPILLARY: 179 mg/dL — AB (ref 65–99)
Glucose-Capillary: 103 mg/dL — ABNORMAL HIGH (ref 65–99)
Glucose-Capillary: 179 mg/dL — ABNORMAL HIGH (ref 65–99)
Glucose-Capillary: 195 mg/dL — ABNORMAL HIGH (ref 65–99)
Glucose-Capillary: 195 mg/dL — ABNORMAL HIGH (ref 65–99)
Glucose-Capillary: 196 mg/dL — ABNORMAL HIGH (ref 65–99)

## 2014-12-04 LAB — BASIC METABOLIC PANEL
Anion gap: 12 (ref 5–15)
BUN: 11 mg/dL (ref 6–20)
CHLORIDE: 100 mmol/L — AB (ref 101–111)
CO2: 27 mmol/L (ref 22–32)
Calcium: 7.7 mg/dL — ABNORMAL LOW (ref 8.9–10.3)
Creatinine, Ser: 1.08 mg/dL (ref 0.61–1.24)
GFR calc Af Amer: >60 mL/min (ref >60)
GFR calc non Af Amer: >60 mL/min (ref >60)
Glucose, Bld: 223 mg/dL — ABNORMAL HIGH (ref 65–99)
Potassium: 3.6 mmol/L (ref 3.5–5.1)
Sodium: 139 mmol/L (ref 135–145)

## 2014-12-04 LAB — CBC
HEMATOCRIT: 27.4 % — AB (ref 39.0–52.0)
Hemoglobin: 8.9 g/dL — ABNORMAL LOW (ref 13.0–17.0)
MCH: 28.7 pg (ref 26.0–34.0)
MCHC: 32.5 g/dL (ref 30.0–36.0)
MCV: 88.4 fL (ref 78.0–100.0)
PLATELETS: 229 10*3/uL (ref 150–400)
RBC: 3.1 MIL/uL — ABNORMAL LOW (ref 4.22–5.81)
RDW: 14.6 % (ref 11.5–15.5)
WBC: 7.9 10*3/uL (ref 4.0–10.5)

## 2014-12-04 NOTE — Progress Notes (Signed)
Utilization Review completed. Shreya Lacasse RN BSN CM 

## 2014-12-04 NOTE — Evaluation (Signed)
Physical Therapy Evaluation Patient Details Name: Dale Hunter MRN: [id number] DOB: 1965-08-23 Today's Date: 12/04/2014   History of Present Illness  63 schizophrenic M with DM admitted to [facility] 5/31 with intentional insulin overdose, he was cleared medically and pending behavioral health placement, when encephalopathy worsened 6/14 and he developed rigidity, fever up to 105F rectally. Intubated 6/16 and extubated 6/17. PCCM called for further eval.   Clinical Impression  Limited evaluation due to pt resistant to participation.  Parents answered questions for patient.  Pt admitted with/for intentional OD of insulin, was getting better and about to d/c to behavioral health facility and took a downward turn as described above.  Pt currently limited functionally due to the problems listed below.  (see problems list.)  Pt will benefit from PT to maximize function and safety to be able to get home safely with available assist of family.     Follow Up Recommendations Home health PT;Supervision for mobility/OOB    Equipment Recommendations  Other (comment) (TBA)    Recommendations for Other Services       Precautions / Restrictions Precautions Precautions: Fall Restrictions Weight Bearing Restrictions: Yes LLE Weight Bearing: Non weight bearing      Mobility  Bed Mobility               General bed mobility comments: Resisted attempts to assist him to EOB  Transfers                    Ambulation/Gait                Stairs            Wheelchair Mobility    Modified Rankin (Stroke Patients Only)       Balance                                             Pertinent Vitals/Pain      Home Living Family/patient expects to be discharged to:: Private residence Living Arrangements: Other (Comment) (parents) Available Help at Discharge: Family;Available 24 hours/day Type of Home: House Home Access: Ramped entrance     Home Layout:  One level Home Equipment: Wheelchair - manual;Shower seat      Prior Function Level of Independence: Needs assistance   Gait / Transfers Assistance Needed: transfers independently at times per parents and needs help at time.     Comments: has accidents at time trying to get to the toilet quickly per parents     Hand Dominance        Extremity/Trunk Assessment               Lower Extremity Assessment:  (unable to test, but moves in bed and resisted to EOB)         Communication   Communication: No difficulties  Cognition Arousal/Alertness: Lethargic (or "playing possum") Behavior During Therapy: Flat affect Overall Cognitive Status: Impaired/Different from baseline                      General Comments General comments (skin integrity, edema, etc.): limited evaluation    Exercises        Assessment/Plan    PT Assessment Patient needs continued PT services  PT Diagnosis Generalized weakness   PT Problem List Decreased strength;Decreased activity tolerance;Decreased mobility  PT Treatment Interventions DME instruction;Functional mobility training;Therapeutic activities;Balance training;Patient/family  education   PT Goals (Current goals can be found in the Care Plan section) Acute Rehab PT Goals Patient Stated Goal: pt did not participate Time For Goal Achievement: 12/11/14 Potential to Achieve Goals: Good    Frequency Min 2X/week (may increase frequency when pt decides to participate)   Barriers to discharge        Co-evaluation               End of Session     Patient left: in bed;with family/visitor present           Time: 1650-1706 PT Time Calculation (min) (ACUTE ONLY): 16 min   Charges:   PT Evaluation $Initial PT Evaluation Tier I: 1 Procedure     PT G Codes:        Haani Bakula, Eliseo Gum 12/04/2014, 5:16 PM 12/04/2014  [place], PT [phone]  (pager)

## 2014-12-04 NOTE — Progress Notes (Signed)
Patient trasfered from 77M to 5W07 via bed; alert and oriented x 2; no complaints of pain; IV saline locked in LUA and left IJ; skin intact, dry, Left BKA. Orient patient to room and unit; patient on suicide precaution; fall risk precautions. Will continue to monitor the patient.

## 2014-12-04 NOTE — Progress Notes (Signed)
PULMONARY / CRITICAL CARE MEDICINE   Name: Dale Hunter MRN: [id number] DOB: June 10, 1966    ADMISSION DATE:  11/15/2014 CONSULTATION DATE:  11/29/2014  REFERRING MD :  Dr. Carles Collet  CHIEF COMPLAINT:  Fever  INITIAL PRESENTATION: 29 schizophrenic M with DM admitted to [facility] 5/31 with intentional insulin overdose, he was cleared medically and pending behavioral health placement, when encephalopathy worsened 6/14 and he developed rigidity, fever up to 105F rectally. PCCM called for further eval.    MAJOR EVENTS/TEST RESULTS: 5/31  admitted to [facility] service with intentional insulin OD 6/01  Psych consultation. recommended inpt behavioral health 6/14  High fever, respiratory distress. Transferred to ICU/PCCM service. Concern for NMS. Received dantrolene and bromocriptine. Concern for sepsis. V/Z initiated. CXR c/w pulm edema vs ARDS 6/15  Procalcitonin elevated (7.62). Blood cultures positive for GNRs. Abx adjusted 6/15  TTE: LVEF 55%. Mild hypertrophy 6/16  Persistent severe bilateral AS dz on CXR. Respiratory distress. Intubated.  6/17  CXR markedly improved. Passed SBT and extubated 6/18  Confusion, agitation.  Home meds restarted   INDWELLING DEVICES:: ETT 6/16 >> 6/17 L IJ CVL 6/16 >>   MICRO DATA: Urine 5/31 >> NEG 5/31 blood >> NEG MRSA PCR 6/01 >> NEG Urine 6/06 >> NEG C diff 6/14 >> NEG Urine 6/14 >> NEG Blood 6/14 >> 2/2 serratia  ANTIMICROBIALS:  Vanc 6/14 >> 6/15 Zosyn 6/14 >> 6/15 Cipro 6/15 >> 6/17 Ceftaz 6/15 >> 6/17 Ceftriaxone 6/17 >>   SUBJECTIVE:  No acute events.  Improvement in agitation.  Mental status clearing.  RN reports pt tolerating sips / water without difficulty     VITAL SIGNS: Temp:  [98.8 F (37.1 C)-100.6 F (38.1 C)] 98.9 F (37.2 C) (06/19 0737) Pulse Rate:  [97-113] 97 (06/19 0800) Resp:  [15-37] 19 (06/19 0800) BP: (125-166)/(64-95) 125/64 mmHg (06/19 0800) SpO2:  [88 %-100 %] 99 % (06/19 0800) FiO2 (%):  [45 %-50 %] 50 % (06/18 2100)    HEMODYNAMICS:     VENTILATOR SETTINGS: Vent Mode:  [-]  FiO2 (%):  [45 %-50 %] 50 %   INTAKE / OUTPUT:  Intake/Output Summary (Last 24 hours) at 12/04/14 1017 Last data filed at 12/04/14 0650  Gross per 24 hour  Intake    590 ml  Output   1155 ml  Net   -565 ml   PHYSICAL EXAMINATION: General:  Disheveled adult male in NAD Neuro: No focal deficits, MAE, speech clear, oriented to self / place HEENT:  [place]/AT Cardiovascular: Reg, no M Lungs: even/non-labored, lungs bilaterally clear  Abdomen:  Soft, non-tender, +BS Ext: L BKA, no edema, warm. Prevalon boot on R foot  LABS:  CBC  Recent Labs Lab 12/02/14 0400 12/03/14 0355 12/04/14 0510  WBC 6.2 7.1 7.9  HGB 9.0* 8.9* 8.9*  HCT 28.2* 27.2* 27.4*  PLT 134* 167 229   Coag's  Recent Labs Lab 11/29/14 1530  APTT 69*  INR 1.37   BMET  Recent Labs Lab 12/02/14 0400 12/03/14 0355 12/04/14 0510  NA 142 143 139  K 3.1* 3.3* 3.6  CL 100* 101 100*  CO2 34* 31 27  BUN _0 CREATININE 1.23 1.05 1.08  GLUCOSE 223* 165* 223*   Electrolytes  Recent Labs Lab 11/30/14 0447  12/02/14 0400 12/03/14 0355 12/04/14 0510  CALCIUM 7.7*  < > 7.7* 7.7* 7.7*  MG 1.6*  --  1.9  --   --   PHOS 3.0  --  <1.0*  --   --   < > =  values in this interval not displayed.   Sepsis Markers  Recent Labs Lab 11/29/14 1530 11/29/14 1546 11/29/14 1854 12/01/14 0455 12/03/14 0355  LATICACIDVEN  --  1.5 1.4  --   --   PROCALCITON 7.62  --   --  3.51 0.84   ABG  Recent Labs Lab 11/29/14 1520 12/01/14 1149 12/02/14 0349  PHART 7.414 7.495* 7.512*  PCO2ART 30.2* 41.0 40.7  PO2ART 228* 250.0* 238*   Liver Enzymes  Recent Labs Lab 11/29/14 2325 12/01/14 0455 12/03/14 0355  AST 78* 38 20  ALT 77* 54 30  ALKPHOS 65 63 72  BILITOT 1.1 0.9 0.8  ALBUMIN 2.2* 2.2* 1.8*   Cardiac Enzymes  Recent Labs Lab 11/29/14 2325 11/30/14 0447 12/01/14 0455  TROPONINI 0.04* 0.04* 0.03   Glucose  Recent  Labs Lab 12/03/14 1215 12/03/14 1551 12/03/14 2023 12/04/14 0040 12/04/14 0422 12/04/14 0738  GLUCAP 282* 319* 202* 195* 195* 179*   CXR: 6/18  R sided airspace disease, clearing of L   ASSESSMENT / PLAN:  PULMONARY A:  Hypoxic respiratory failure - extubated 6/17 Pulm edema vs ARDS - CXR improved 6/17 P:   Supplemental O2 to maintain SpO2 > 92% Pulmonary hygiene as able Intermittent CXR  CARDIOVASCULAR A:  Sinus tachycardia, mild Abnormal EKG  P:  Monitor hemodynamics, rhythm  RENAL A:  AKI - Cr improved Hypokalemia  Met acidosis, resolved P:   Monitor BMET intermittently. Monitor I/Os. Correct electrolytes as indicated.  GASTROINTESTINAL A:   Diarrhea, C diff neg Protein Calorie Malnutrition  P:   SUP: N/I. Clear liquid diet, advance as tolerated  HEMATOLOGIC A:   Mild anemia without acute blood loss Mild acute thrombocytopenia P:  DVT px: SCDs. Monitor CBC intermittently. Transfuse per ICU guidelines.  INFECTIOUS A:   Fever, resolved Serratia bacteremia - unclear source Severe sepsis, resolving P:   Monitor temp, WBC count Micro and abx as above  ENDOCRINE A:   DM 1 P:   Cont Levemir 6 units Cont SSI - sens scale.  Change to ACHS once regular diet tolerated   NEUROLOGIC A:   Schizophrenia Intentional insulin OD 5/31 Doubt NMS - Ck improving P:   RASS goal: 0 Continue namenda, aricept, saphris  Psych following  Safety / suicide sitter    GLOBAL:   Improved mental status.  Transfer to SDU and TRH as of am 6/20 0700.     Noe Gens, NP-C [place] [facility] Pgr: [phone] or [phone]

## 2014-12-05 LAB — IRON AND TIBC
IRON: 25 ug/dL — AB (ref 45–182)
SATURATION RATIOS: 15 % — AB (ref 17.9–39.5)
TIBC: 165 ug/dL — AB (ref 250–450)
UIBC: 140 ug/dL

## 2014-12-05 LAB — GLUCOSE, CAPILLARY
GLUCOSE-CAPILLARY: 162 mg/dL — AB (ref 65–99)
GLUCOSE-CAPILLARY: 186 mg/dL — AB (ref 65–99)
GLUCOSE-CAPILLARY: 227 mg/dL — AB (ref 65–99)
Glucose-Capillary: 117 mg/dL — ABNORMAL HIGH (ref 65–99)
Glucose-Capillary: 142 mg/dL — ABNORMAL HIGH (ref 65–99)
Glucose-Capillary: 204 mg/dL — ABNORMAL HIGH (ref 65–99)
Glucose-Capillary: 290 mg/dL — ABNORMAL HIGH (ref 65–99)

## 2014-12-05 LAB — FERRITIN: FERRITIN: 152 ng/mL (ref 24–336)

## 2014-12-05 LAB — FOLATE: FOLATE: 22.9 ng/mL (ref >5.9)

## 2014-12-05 LAB — VITAMIN B12: Vitamin B-12: 2605 pg/mL — ABNORMAL HIGH (ref 180–914)

## 2014-12-05 LAB — RETICULOCYTES
RBC.: 3.14 MIL/uL — AB (ref 4.22–5.81)
RETIC COUNT ABSOLUTE: 53.4 10*3/uL (ref 19.0–186.0)
Retic Ct Pct: 1.7 % (ref 0.4–3.1)

## 2014-12-05 MED ORDER — BOOST / RESOURCE BREEZE PO LIQD
1.0000 | Freq: Three times a day (TID) | ORAL | Status: DC
  Administered 2014-12-06 – 2014-12-07 (×3): 1 via ORAL

## 2014-12-05 MED ORDER — SODIUM CHLORIDE 0.9 % IJ SOLN
10.0000 mL | INTRAMUSCULAR | Status: DC | PRN
  Administered 2014-12-05: 30 mL
  Administered 2014-12-06 (×3): 10 mL
  Administered 2014-12-06: 20 mL
  Filled 2014-12-05 (×5): qty 40

## 2014-12-05 MED ORDER — SODIUM CHLORIDE 0.45 % IV SOLN
INTRAVENOUS | Status: DC
  Administered 2014-12-05: 1000 mL via INTRAVENOUS
  Administered 2014-12-06: 07:00:00 via INTRAVENOUS

## 2014-12-05 NOTE — Progress Notes (Signed)
PT Cancellation Note  Patient Details Name: Dale Hunter MRN: [id number] DOB: Jun 25, 1965   Cancelled Treatment:    Reason Eval/Treat Not Completed: Other (comment)   Pt adamantly refusing any mobility at time of PT attempt (about 10:15am)  Van Clines, PT  Acute Rehabilitation Services Pager [phone] Office [phone]    Van Clines Hamff 12/05/2014, 1:20 PM

## 2014-12-05 NOTE — Progress Notes (Signed)
Patient ID: Dale Hunter, male   DOB: [birth year], 49 y.o.   MRN: [id number]  [facility] PROGRESS NOTE  Dale Hunter WUJ:[id number] DOB: 1965-11-08 DOA: 11/15/2014 PCP: Default, Provider, MD   Brief narrative:    71 schizophrenic M with DM admitted to [facility] 5/31 with intentional insulin overdose, he was cleared medically and pending behavioral health placement, when encephalopathy worsened 6/14 and he developed rigidity, fever up to 105F rectally. PCCM called for further eval.   MAJOR EVENTS/TEST RESULTS: 5/31 admitted to [facility] service with intentional insulin OD 6/01 Psych consultation. recommended inpt behavioral health 6/14 High fever, respiratory distress. Transferred to ICU/PCCM service. Concern for NMS. Received dantrolene and bromocriptine. Concern for sepsis. V/Z initiated. CXR c/w pulm edema vs ARDS 6/15 Procalcitonin elevated (7.62). Blood cultures positive for GNRs. Abx adjusted 6/15 TTE: LVEF 55%. Mild hypertrophy 6/16 Persistent severe bilateral AS dz on CXR. Respiratory distress. Intubated.  6/17 CXR markedly improved. Passed SBT and extubated 6/18 Confusion, agitation. Home meds restarted 6/20 care transferred to [facility] from critical care team  Assessment/Plan:    Acute hypoxic respiratory failure secondary pulmonary edema versus ARDS, multifocal PNA, ventilator dependent respiratory failure - Patient successfully extubated 12/02/2014 - Still requiring 2 - 4 L of oxygen via nasal cannula - Continue to provide oxygen to maintain saturations above 92% - Pulmonary hygiene as patient able to tolerate - Intermittent chest x-ray for follow-up as needed  Sinus tachycardia - Overall asymptomatic, denies chest pain - Sinus rhythm - Monitor on telemetry for now  Acute kidney failure with metabolic acidosis - Prerenal in etiology, secondary to sepsis - IV fluids have been provided, this is now resolved - electrolytes remained stable, we'll repeat BMP in the  morning  Hypokalemia with hypomagnesemia and hypophosphatemia  - Supplemented - Repeat BMP, Mg and phos level in the morning  Severe sepsis secondary to Serratia bacteremia - Unclear source, possible from multifocal PNA as suggested per CXR  - Patient successfully transitioned to Rocephin per sensitivity report - We'll request repeat blood cultures to ensure resolution - Sepsis etiology resolved - will need total of 10 days of ABX therapy per ID recommendations   Diarrhea - Possibly anti-biotic induced, C. difficile neg - Resolved  Protein calorie malnutrition, moderate   - Patient refusing to eat, encourage oral intake - Has been on clear liquid diet, advance as patient able to tolerate - Nutritionist consulted  Anemia of acute illness - We will ask for an anemia panel with next blood draw - No baseline hemoglobin known when - Repeat CBC in the morning  Diabetes mellitus type 1, with complications of hypoglycemia, neuropathies - A1c 10.4 - Continue insulin Levemir along with sliding scale insulin  Acute encephalopathy  - Secondary to acute illness sepsis, acute respiratory failure, intentional insulin overdose on 11/15/2014 imposed on history of schizophrenia  - Mental status slowly improving, patient still agitated at times, per staff aggressive at times - Keep sitter in the room - Awaiting for psychiatric consultation and recommendations to see if pt can be cleared for d/c to SNF   Code Status: Full.  Family Communication:  plan of care discussed with the patient, no family at bedside  Disposition Plan: not ready for d/c due to IV ABX requirement, can pt be cleared for d/c to SNF (psych to address)   Indwelling devices:   ETT 6/16 >> 6/17 L IJ CVL 6/16 >>   Procedures and diagnostic studies:     Dg Chest [facility] 1 View 12/03/2014  Slight improvement in hazy right greater left airspace opacity, ? pulmonary edema although infection, hemorrhage, or protein could appear  similar.   Dg Chest Port 1 View 12/03/2014 Diffuse right-sided airspace opacification, mostly new from the prior study. Worsening left basilar airspace opacity. This is concerning for severe multifocal pneumonia. ARDS cannot be excluded. Suspect small left pleural effusion.   Dg Chest Port 1 View 12/02/2014   Lines and tubes in stable position. Interim significant clearing of bilateral pulmonary infiltrates. Mild bilateral infiltrates remain.     Dg Chest Port 1 View 12/01/2014    Endotracheal tube in good position.  Central venous catheter in good position  Progression of bilateral airspace disease, possible edema.     Dg Chest Port 1 View 12/01/2014  Persistent unchanged dense bilateral pulmonary infiltrates and or edema. Heart size remains normal.     Dg Chest [facility] 11/29/2014    Interval development of pulmonary edema. Cardiogenic edema would be most likely, however, given the absence of cardiomegaly, alternative differential diagnosis should be considered, including neurogenic edema, fat emboli, drug toxicity/overdose, allergic reaction, aspiration/drowning, pulmonary hemorrhage/infarction, as well as other non-specific causes of pneumonitis.    Dg Foot Complete Right 11/15/2014  No acute osseous erosions seen. 2. Charcot joint at the hindfoot, somewhat worsened from the prior study. 3. Mild diffuse osteopenia of visualized osseous structures. 4. Os peroneum noted.   Dg Tibia/fibula Right 11/15/2014   Unchanged appearance of the right ankle with extensive deformity and degenerative changes of the hindfoot.  Soft tissue swelling   Medical Consultants:  Psych   Other Consultants:  None  Micro date and antimicrobials:  MICRO DATA: Urine 5/31 >> NEG 5/31 blood >> NEG MRSA PCR 6/01 >> NEG Urine 6/06 >> NEG C diff 6/14 >> NEG Urine 6/14 >> NEG Blood 6/14 >> 2/2 serratia  ANTIMICROBIALS:  Vanc 6/14 >> 6/15 Zosyn 6/14 >> 6/15 Cipro 6/15 >> 6/17 Ceftaz 6/15 >> 6/17 Ceftriaxone  6/17 >>   [name], MD  [facility] Pager [phone]  If 7PM-7AM, please contact night-coverage [email or web address] Password [facility] 12/05/2014, 10:56 AM   LOS: 20 days   HPI/Subjective: No events overnight.   Objective: Filed Vitals:   12/04/14 1316 12/04/14 1359 12/04/14 2141 12/05/14 0530  BP: 129/75 132/83 131/82 142/84  Pulse: 97 99 102 97  Temp: 98.4 F (36.9 C) 98.9 F (37.2 C) 97.4 F (36.3 C) 98.8 F (37.1 C)  TempSrc: Oral Oral Oral Oral  Resp: Height:      Weight:  68.4 kg (150 lb 12.7 oz)    SpO2: 95% 95% 98% 100%    Intake/Output Summary (Last 24 hours) at 12/05/14 1056 Last data filed at 12/05/14 1049  Gross per 24 hour  Intake     60 ml  Output    775 ml  Net   -715 ml    Exam:   General:  Pt is alert, refuses to answer some questions, NAD  Cardiovascular: Regular rate and rhythm, S1/S2, no murmurs, no rubs, no gallops  Respiratory: Rhonchi at bases and diminished bilaterally   Abdomen: Soft, non tender, non distended  Ext: Left BKA  Data Reviewed: Basic Metabolic Panel:  Recent Labs Lab 11/30/14 0447 12/01/14 0455 12/02/14 0400 12/03/14 0355 12/04/14 0510  NA 139 142 142 143 139  K 4.4 3.2* 3.1* 3.3* 3.6  CL 103 99* 100* 101 100*  CO2 26 30 34* 31 27  GLUCOSE 266* 116* 223* 165* 223*  BUN 17 11 17 12 11   CREATININE 1.45* 1.14 1.23 1.05 1.08  CALCIUM 7.7* 7.5* 7.7* 7.7* 7.7*  MG 1.6*  --  1.9  --   --   PHOS 3.0  --  <1.0*  --   --    Liver Function Tests:  Recent Labs Lab 11/29/14 2325 12/01/14 0455 12/03/14 0355  AST 78* 38 20  ALT 77* 54 30  ALKPHOS 65 63 72  BILITOT 1.1 0.9 0.8  PROT 5.5* 5.6* 5.3*  ALBUMIN 2.2* 2.2* 1.8*   CBC:  Recent Labs Lab 11/30/14 0447 12/01/14 0455 12/02/14 0400 12/03/14 0355 12/04/14 0510  WBC 6.5 5.7 6.2 7.1 7.9  HGB 11.2* 10.0* 9.0* 8.9* 8.9*  HCT 34.0* 31.1* 28.2* 27.2* 27.4*  MCV 88.8 89.6 90.7 90.7 88.4  PLT 128* 127* 134* 167 229   Cardiac Enzymes:  Recent  Labs Lab 11/29/14 1828 11/29/14 2124 11/29/14 2325 11/30/14 0447 12/01/14 0455  CKTOTAL 1081*  --   --   --  507*  TROPONINI 0.05* 0.04* 0.04* 0.04* 0.03   CBG:  Recent Labs Lab 12/04/14 1713 12/04/14 1954 12/05/14 0003 12/05/14 0352 12/05/14 0746  GLUCAP 179* 103* 117* 142* 204*    Recent Results (from the past 240 hour(s))  Culture, blood (routine x 2)     Status: None   Collection Time: 11/29/14  3:30 PM  Result Value Ref Range Status   Specimen Description BLOOD RIGHT HAND  Final   Culture   Final    SERRATIA MARCESCENS    Report Status 12/02/2014 FINAL  Final   Organism ID, Bacteria SERRATIA MARCESCENS  Final      Susceptibility   Serratia marcescens - MIC*    CEFAZOLIN >=64 RESISTANT Resistant     CEFEPIME <=1 SENSITIVE Sensitive     CEFTAZIDIME <=1 SENSITIVE Sensitive     CEFTRIAXONE <=1 SENSITIVE Sensitive     CIPROFLOXACIN <=0.25 SENSITIVE Sensitive     GENTAMICIN 2 SENSITIVE Sensitive     TOBRAMYCIN 8 INTERMEDIATE Intermediate     TRIMETH/SULFA <=20 SENSITIVE Sensitive     * SERRATIA MARCESCENS  Urine culture     Status: None   Collection Time: 11/29/14  3:38 PM  Result Value Ref Range Status   Specimen Description URINE, RANDOM  Final   Special Requests NONE  Final   Report Status 11/30/2014 FINAL  Final  Clostridium Difficile by PCR (not at [facility])     Status: None   Collection Time: 11/29/14  3:38 PM  Result Value Ref Range Status   C difficile by pcr NEGATIVE NEGATIVE Final  Culture, blood (routine x 2)     Status: None   Collection Time: 11/29/14  3:46 PM  Result Value Ref Range Status   Specimen Description BLOOD RIGHT FOREARM  Final   Culture   Final    SERRATIA MARCESCEN    Report Status 12/02/2014 FINAL  Final     Scheduled Meds: . asenapine  10 mg Sublingual Daily  . cefTRIAXone (ROCEPHIN)  IV  1 g Intravenous Q24H  . donepezil  10 mg Oral QHS  . enoxaparin (LOVENOX) injection  40 mg Subcutaneous Q24H  . insulin aspart  0-9 Units  Subcutaneous 6 times per day  . insulin detemir  6 Units Subcutaneous Daily  . memantine  10 mg Oral BID  . pantoprazole (PROTONIX) IV  40 mg Intravenous Q24H  . pneumococcal 23 valent vaccine  0.5 mL Intramuscular Tomorrow-1000   Continuous Infusions: . sodium chloride

## 2014-12-05 NOTE — Progress Notes (Signed)
Nutrition Follow-up  DOCUMENTATION CODES:  Not applicable  INTERVENTION:  -Resource Breeze po TID, each supplement provides 250 kcal and 9 grams of protein  NUTRITION DIAGNOSIS:  Inadequate oral intake related to other (see comment) (poor appetite, refusal of meals) as evidenced by meal completion < 25% (staff report).  GOAL:  Patient will meet greater than or equal to 90% of their needs  MONITOR:  PO intake, Supplement acceptance, Diet advancement, Skin, Weight trends, Labs, I & O's  REASON FOR ASSESSMENT:  Consult Enteral/tube feeding initiation and management  ASSESSMENT: Patient admitted on 5/31 with intentional insulin overdose. Transferred to the ICU on 6/14 with respiratory distress and high fever. CXR showed pulmonary edema. Required intubation 6/16.  Pt extubated and transferred to medical floor on 12/02/14.   He is currently on a clear liquid diet. Staff report that pt has been refusing meals and other aspects of care. Pt was sleeping soundly at time of visit; sitter at bedside and clear liquid meal tray unattempted.   CSW and pysch following. Potential for d/c to inpatient psychiatric facility.   Height:  Ht Readings from Last 1 Encounters:  11/16/14 5\' 10"  (1.778 m)    Weight:  Wt Readings from Last 1 Encounters:  12/04/14 150 lb 12.7 oz (68.4 kg)    Ideal Body Weight:  75.5 kg  Wt Readings from Last 10 Encounters:  12/04/14 150 lb 12.7 oz (68.4 kg)    BMI:  Body mass index is 21.64 kg/(m^2).  Estimated Nutritional Needs:  Kcal:  1700-1900  Protein:  75-85 grams  Fluid:  1.7-1.9 L  Skin:  Reviewed, no issues  Diet Order:  Diet clear liquid Room service appropriate?: Yes; Fluid consistency:: Thin  EDUCATION NEEDS:  No education needs identified at this time   Intake/Output Summary (Last 24 hours) at 12/05/14 1549 Last data filed at 12/05/14 1300  Gross per 24 hour  Intake    380 ml  Output    850 ml  Net   -470 ml    Last BM:   12/04/14  Ravinder Lukehart A. Mayford Knife, RD, LDN, CDE Pager: [phone] After hours Pager: [phone]

## 2014-12-05 NOTE — Clinical Social Work Note (Addendum)
Covering CSW spoke with Robinette from [facility] [phone]. Robinette reported that they can not take this pt today. .   Robinette reported that the pt is no longer on the waiting list. The pt was dropped from the waiting list on 12/02/2014. Robinette reported that a new referral is needed. CSW faxed the new referral information to Robinette at [phone]. The case manager is aware.   Addendum: CSW received a call from [place] at [facility] Junious Dresser confirmed that they received the new referral. Junious Dresser reported that they can not review the clinicals until the pt has been off IV abx and oxygen for at less 24 hours.   Vint Pola, MSW, Theresia Majors [phone]

## 2014-12-05 NOTE — Progress Notes (Signed)
Called on-call provider regarding patient's refusal of PO intake and decreased urine output. Orders given for IVF. Will start IVF and continue to monitor.
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
## 2014-12-06 DIAGNOSIS — J9600 Acute respiratory failure, unspecified whether with hypoxia or hypercapnia: Secondary | ICD-10-CM | POA: Insufficient documentation

## 2014-12-06 LAB — CBC
HCT: 27 % — ABNORMAL LOW (ref 39.0–52.0)
Hemoglobin: 8.6 g/dL — ABNORMAL LOW (ref 13.0–17.0)
MCH: 28.1 pg (ref 26.0–34.0)
MCHC: 31.9 g/dL (ref 30.0–36.0)
MCV: 88.2 fL (ref 78.0–100.0)
PLATELETS: 322 10*3/uL (ref 150–400)
RBC: 3.06 MIL/uL — AB (ref 4.22–5.81)
RDW: 14.7 % (ref 11.5–15.5)
WBC: 7.1 10*3/uL (ref 4.0–10.5)

## 2014-12-06 LAB — GLUCOSE, CAPILLARY
GLUCOSE-CAPILLARY: 249 mg/dL — AB (ref 65–99)
Glucose-Capillary: 139 mg/dL — ABNORMAL HIGH (ref 65–99)
Glucose-Capillary: 192 mg/dL — ABNORMAL HIGH (ref 65–99)
Glucose-Capillary: 278 mg/dL — ABNORMAL HIGH (ref 65–99)
Glucose-Capillary: 184 mg/dL — ABNORMAL HIGH (ref 65–99)

## 2014-12-06 LAB — BASIC METABOLIC PANEL
Anion gap: 9 (ref 5–15)
BUN: 10 mg/dL (ref 6–20)
CALCIUM: 7.7 mg/dL — AB (ref 8.9–10.3)
CHLORIDE: 100 mmol/L — AB (ref 101–111)
CO2: 30 mmol/L (ref 22–32)
CREATININE: 0.95 mg/dL (ref 0.61–1.24)
GFR calc Af Amer: >60 mL/min (ref >60)
GFR calc non Af Amer: >60 mL/min (ref >60)
Glucose, Bld: 158 mg/dL — ABNORMAL HIGH (ref 65–99)
Potassium: 3 mmol/L — ABNORMAL LOW (ref 3.5–5.1)
Sodium: 139 mmol/L (ref 135–145)

## 2014-12-06 LAB — MAGNESIUM: Magnesium: 1.9 mg/dL (ref 1.7–2.4)

## 2014-12-06 LAB — PHOSPHORUS: PHOSPHORUS: 2.5 mg/dL (ref 2.5–4.6)

## 2014-12-06 MED ORDER — FERROUS SULFATE 325 (65 FE) MG PO TABS
325.0000 mg | ORAL_TABLET | Freq: Two times a day (BID) | ORAL | Status: DC
  Administered 2014-12-06 – 2014-12-07 (×2): 325 mg via ORAL
  Filled 2014-12-06 (×5): qty 1

## 2014-12-06 MED ORDER — POTASSIUM CHLORIDE CRYS ER 20 MEQ PO TBCR
40.0000 meq | EXTENDED_RELEASE_TABLET | Freq: Once | ORAL | Status: AC
  Administered 2014-12-06: 40 meq via ORAL
  Filled 2014-12-06: qty 2

## 2014-12-06 NOTE — Consult Note (Signed)
Psychiatry Consult Follow Up  Reason for Consult:  Psychosis and intentional overdose of insulin as a suicide attempt Referring Physician:  Dr. Cruzita Lederer Patient Identification: Dale Hunter MRN:  [id number] Principal Diagnosis: Non compliance w medication regimen and schizophrenia, paranoid Diagnosis:   Patient Active Problem List   Diagnosis Date Noted  . Acute respiratory failure [J96.00]   . Acute respiratory failure with hypoxemia [J96.01]   . ARDS (adult respiratory distress syndrome) [J80]   . Acute pulmonary edema [J81.0]   . Bacteremia due to Gram-negative bacteria [R78.81, A41.50]   . Metabolic acidosis [J00.9] 11/29/2014  . Dyspnea [R06.00]   . Hypokalemia [E87.6] 11/28/2014  . Pressure ulcer [L89.90] 11/17/2014  . 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 Non compliance w medication regimen [Z91.14] 11/16/2014  . Severe diabetic hypoglycemia [E11.649]   . Swelling [R60.9]   . Insulin overdose [T38.3X1A] 11/15/2014  . Hypoglycemia [E16.2] 11/15/2014  . Diabetes mellitus [E11.9] 12/08/2013  . Mood disorder [F39] 12/08/2013  . History of osteomyelitis [Z87.39] 12/08/2013  . 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 Cellulitis [L03.90] 12/08/2013    Total Time spent with patient: 20 minutes  Subjective:   Dale Hunter is a 49 y.o. male patient admitted with suicide attempt and hypoglycemia.  Subjective: Patient seen today for psychiatric consultation and follow-up. Patient has been stable without behavioral problems. Patient continued to be poorly cooperative with medication management. Patient continued to have disorganized thought process and somewhat paranoid ideations. Patient repeatedly stated he does not understand why 1 day can use as the other day cannot use units. Patient continued to ruminate about his father has been trying to get rid of him and also harm his mother. Patient has been free from symptoms of neurolytic  malignant syndrome including high fever, rigidity, tremors and decreased renal function including creatinine. Patient continued to be poor communicator but his awake, alert and oriented to person. Patient has an oxygen mask and intravenous antibiotics for infections. Patient may benefit from Clozaril if he is cooperative with oral medication and compliant with treatment recommendation. Patient was not accepted based [facility] secondary to complicated medical problems and treatment needs as for psychiatric social service contact with central [facility] hospitalization administrative coordinators.    Past Medical History:  Past Medical History  Diagnosis Date  . Diabetes mellitus without complication   . Osteomyelitis   . Schizophrenia     Past Surgical History  Procedure Laterality Date  . Below knee leg amputation     Family History: History reviewed. No pertinent family history. Social History:  History  Alcohol Use No     History  Drug Use No    History   Social History  . Marital Status: Single    Spouse Name: N/A  . Number of Children: N/A  . Years of Education: N/A   Social History Main Topics  . Smoking status: Never Smoker   . Smokeless tobacco: Not on file  . Alcohol Use: No  . Drug Use: No  . Sexual Activity: Not on file   Other Topics Concern  . None   Social History Narrative   Additional Social History:    Pain Medications: See PTA Prescriptions: SEE PTA, reports not compliant with medications Over the Counter: See PTA History of alcohol / drug use?: No history of alcohol / drug abuse Longest period of sobriety (when/how long):  (NA) Negative Consequences of Use:  (NA) Withdrawal Symptoms:  (NA)  Allergies:   Allergies  Allergen Reactions  . Garlic Other (See Comments)    STOMACH ISSUES  . Advil [Ibuprofen] Rash    Labs:  Results for orders placed or performed during the hospital encounter of  11/15/14 (from the past 48 hour(s))  Glucose, capillary     Status: Abnormal   Collection Time: 12/04/14 12:06 PM  Result Value Ref Range   Glucose-Capillary 196 (H) 65 - 99 mg/dL  Glucose, capillary     Status: Abnormal   Collection Time: 12/04/14  5:13 PM  Result Value Ref Range   Glucose-Capillary 179 (H) 65 - 99 mg/dL  Glucose, capillary     Status: Abnormal   Collection Time: 12/04/14  7:54 PM  Result Value Ref Range   Glucose-Capillary 103 (H) 65 - 99 mg/dL   Comment 1 Notify RN    Comment 2 Document in Chart   Glucose, capillary     Status: Abnormal   Collection Time: 12/05/14 12:03 AM  Result Value Ref Range   Glucose-Capillary 117 (H) 65 - 99 mg/dL   Comment 1 Notify RN    Comment 2 Document in Chart   Glucose, capillary     Status: Abnormal   Collection Time: 12/05/14  3:52 AM  Result Value Ref Range   Glucose-Capillary 142 (H) 65 - 99 mg/dL   Comment 1 Notify RN    Comment 2 Document in Chart   Glucose, capillary     Status: Abnormal   Collection Time: 12/05/14  7:46 AM  Result Value Ref Range   Glucose-Capillary 204 (H) 65 - 99 mg/dL  Glucose, capillary     Status: Abnormal   Collection Time: 12/05/14 12:17 PM  Result Value Ref Range   Glucose-Capillary 186 (H) 65 - 99 mg/dL  Vitamin B12     Status: Abnormal   Collection Time: 12/05/14 12:30 PM  Result Value Ref Range   Vitamin B-12 2605 (H) 180 - 914 pg/mL    Comment: (NOTE) This assay is not validated for testing neonatal or myeloproliferative syndrome specimens for Vitamin B12 levels.   Folate     Status: None   Collection Time: 12/05/14 12:30 PM  Result Value Ref Range   Folate 22.9 >5.9 ng/mL  Iron and TIBC     Status: Abnormal   Collection Time: 12/05/14 12:30 PM  Result Value Ref Range   Iron 25 (L) 45 - 182 ug/dL   TIBC 165 (L) 250 - 450 ug/dL   Saturation Ratios 15 (L) 17.9 - 39.5 %   UIBC 140 ug/dL  Ferritin     Status: None   Collection Time: 12/05/14 12:30 PM  Result Value Ref Range    Ferritin 152 24 - 336 ng/mL  Reticulocytes     Status: Abnormal   Collection Time: 12/05/14 12:30 PM  Result Value Ref Range   Retic Ct Pct 1.7 0.4 - 3.1 %   RBC. 3.14 (L) 4.22 - 5.81 MIL/uL   Retic Count, Manual 53.4 19.0 - 186.0 K/uL  Glucose, capillary     Status: Abnormal   Collection Time: 12/05/14  3:57 PM  Result Value Ref Range   Glucose-Capillary 290 (H) 65 - 99 mg/dL  Glucose, capillary     Status: Abnormal   Collection Time: 12/05/14  8:07 PM  Result Value Ref Range   Glucose-Capillary 227 (H) 65 - 99 mg/dL  Glucose, capillary     Status: Abnormal   Collection Time: 12/05/14 11:57 PM  Result Value Ref Range  Glucose-Capillary 162 (H) 65 - 99 mg/dL  Glucose, capillary     Status: Abnormal   Collection Time: 12/06/14  4:04 AM  Result Value Ref Range   Glucose-Capillary 139 (H) 65 - 99 mg/dL  CBC     Status: Abnormal   Collection Time: 12/06/14  4:35 AM  Result Value Ref Range   WBC 7.1 4.0 - 10.5 K/uL   RBC 3.06 (L) 4.22 - 5.81 MIL/uL   Hemoglobin 8.6 (L) 13.0 - 17.0 g/dL   HCT 27.0 (L) 39.0 - 52.0 %   MCV 88.2 78.0 - 100.0 fL   MCH 28.1 26.0 - 34.0 pg   MCHC 31.9 30.0 - 36.0 g/dL   RDW 14.7 11.5 - 15.5 %   Platelets 322 150 - 400 K/uL  Basic metabolic panel     Status: Abnormal   Collection Time: 12/06/14  4:35 AM  Result Value Ref Range   Sodium 139 135 - 145 mmol/L   Potassium 3.0 (L) 3.5 - 5.1 mmol/L   Chloride 100 (L) 101 - 111 mmol/L   CO2 30 22 - 32 mmol/L   Glucose, Bld 158 (H) 65 - 99 mg/dL   BUN 10 6 - 20 mg/dL   Creatinine, Ser 0.95 0.61 - 1.24 mg/dL   Calcium 7.7 (L) 8.9 - 10.3 mg/dL   GFR calc non Af Amer >60 >60 mL/min   GFR calc Af Amer >60 >60 mL/min    Comment: (NOTE) The eGFR has been calculated using the CKD EPI equation. This calculation has not been validated in all clinical situations. eGFR's persistently <60 mL/min signify possible Chronic Kidney Disease.    Anion gap 9 5 - 15  Magnesium     Status: None   Collection Time:  12/06/14  4:35 AM  Result Value Ref Range   Magnesium 1.9 1.7 - 2.4 mg/dL  Phosphorus     Status: None   Collection Time: 12/06/14  4:35 AM  Result Value Ref Range   Phosphorus 2.5 2.5 - 4.6 mg/dL  Glucose, capillary     Status: Abnormal   Collection Time: 12/06/14  8:07 AM  Result Value Ref Range   Glucose-Capillary 192 (H) 65 - 99 mg/dL    Vitals: Blood pressure 149/84, pulse 94, temperature 98.5 F (36.9 C), temperature source Oral, resp. rate 18, height '5\' 10"'  (1.778 m), weight 68.4 kg (150 lb 12.7 oz), SpO2 93 %.  Risk to Self: Suicidal Ideation: Yes-Currently Present Suicidal Intent: Yes-Currently Present Is patient at risk for suicide?: Yes Suicidal Plan?: Yes-Currently Present Specify Current Suicidal Plan: pt overdosed on insulin, reports suicidal to prevent his father from using pt to kill mother Access to Means: Yes Specify Access to Suicidal Means: medications What has been your use of drugs/alcohol within the last 12 months?: none How many times?: 0 Other Self Harm Risks: none Triggers for Past Attempts: None known Intentional Self Injurious Behavior: None Risk to Others: Homicidal Ideation: Yes-Currently Present Thoughts of Harm to Others: No Current Homicidal Intent: No Current Homicidal Plan: No Access to Homicidal Means: No Identified Victim: father, ideation no planning  History of harm to others?: No Assessment of Violence: None Noted Violent Behavior Description: none Does patient have access to weapons?: No Criminal Charges Pending?: No Does patient have a court date: No Prior Inpatient Therapy: Prior Inpatient Therapy: Yes Prior Therapy Dates: March 2016 Prior Therapy Facilty/Provider(s): [facility] Reason for Treatment: delusions, dx with schizophrenia  Prior Outpatient Therapy: Prior Outpatient Therapy: No Prior Therapy Dates:  NA Prior Therapy Facilty/Provider(s): NA Reason for Treatment: NA Does patient have an ACCT team?: No Does patient have  Intensive In-House Services?  : No Does patient have [facility] services? : No Does patient have P4CC services?: Unknown  Current Facility-Administered Medications  Medication Dose Route Frequency Provider Last Rate Last Dose  . acetaminophen (TYLENOL) suppository 650 mg  650 mg Rectal Q4H PRN Orson Eva, MD   650 mg at 11/29/14 1523  . asenapine (SAPHRIS) sublingual tablet 10 mg  10 mg Sublingual Daily Kara Mead V, MD   10 mg at 12/06/14 0932  . cefTRIAXone (ROCEPHIN) 1 g in dextrose 5 % 50 mL IVPB - Premix  1 g Intravenous Q24H Wilhelmina Mcardle, MD   1 g at 12/05/14 1600  . donepezil (ARICEPT) tablet 10 mg  10 mg Oral QHS Rigoberto Noel, MD   10 mg at 12/04/14 2232  . enoxaparin (LOVENOX) injection 40 mg  40 mg Subcutaneous Q24H Wilhelmina Mcardle, MD   40 mg at 12/05/14 1047  . feeding supplement (RESOURCE BREEZE) (RESOURCE BREEZE) liquid 1 Container  1 Container Oral TID BM Domenick Bookbinder, RD   1 Container at 12/05/14 1703  . ferrous sulfate tablet 325 mg  325 mg Oral BID WC Theodis Blaze, MD      . insulin aspart (novoLOG) injection 0-9 Units  0-9 Units Subcutaneous 6 times per day Caren Griffins, MD   2 Units at 12/06/14 0935  . insulin detemir (LEVEMIR) injection 6 Units  6 Units Subcutaneous Daily Costin Karlyne Greenspan, MD   6 Units at 12/06/14 [id number]  . memantine (NAMENDA) tablet 10 mg  10 mg Oral BID Rigoberto Noel, MD   10 mg at 12/06/14 0932  . pantoprazole (PROTONIX) injection 40 mg  40 mg Intravenous Q24H Anders Simmonds, MD   40 mg at 12/05/14 2247  . pneumococcal 23 valent vaccine (PNU-IMMUNE) injection 0.5 mL  0.5 mL Intramuscular Tomorrow-1000 Etta Quill, DO   Stopped at 11/18/14 1000  . potassium chloride SA (K-DUR,KLOR-CON) CR tablet 40 mEq  40 mEq Oral Once Theodis Blaze, MD      . sodium chloride 0.9 % injection 10-40 mL  10-40 mL Intracatheter PRN Theodis Blaze, MD   20 mL at 12/06/14 0440    Musculoskeletal: Strength & Muscle Tone: decreased Gait & Station: unable to  stand Patient leans: N/A  Psychiatric Specialty Exam: Physical Exam   ROS   Blood pressure 149/84, pulse 94, temperature 98.5 F (36.9 C), temperature source Oral, resp. rate 18, height '5\' 10"'  (1.778 m), weight 68.4 kg (150 lb 12.7 oz), SpO2 93 %.Body mass index is 21.64 kg/(m^2).  General Appearance: Guarded  Eye Contact::  Fair  Speech:  Blocked and Slow  Volume:  Decreased  Mood:  Depressed  Affect:  Constricted and Inappropriate  Thought Process:  Disorganized, Irrelevant and Loose  Orientation:  Full (Time, Place, and Person)  Thought Content:  Delusions, Paranoid Ideation and Rumination  Suicidal Thoughts:  Yes.  with intent/plan  Homicidal Thoughts:  No  Memory:  Immediate;   Fair Recent;   Fair  Judgement:  Impaired  Insight:  Lacking  Psychomotor Activity:  Decreased  Concentration:  Fair  Recall:  [place] of Knowledge:Good  Language: Good  Akathisia:  Negative  Handed:  Right  AIMS (if indicated):     Assets:  Agricultural consultant Housing Intimacy Leisure Time Resilience Social Support  ADL's:  Impaired  Cognition: Impaired,  Mild  Sleep:      Medical Decision Making: Review of Psycho-Social Stressors (1), Review or order clinical lab tests (1), Established Problem, Worsening (2), Review or order medicine tests (1), Review of Medication Regimen & Side Effects (2) and Review of New Medication or Change in Dosage (2)  Treatment Plan Summary: Patient has neurolytic malignant syndrome which has been resolved with supportive therapy and medication management. provided supportive therapy and bromocriptine. Patient required to be transferred to medical intensive care unit at this time. Patient family were informed about clinical changes and new treatment plan.  Daily contact with patient to assess and evaluate symptoms and progress in treatment and Medication management  Plan:  Suicide attempt: Safety monitoring - denied current  suicide ideations Hypoxic respiratory failure/ARDS: Resolved Saphris and abilify started at critical care for schizophrenia, tolerating well - no side effects seen Namenda and Aricept - started by critical care  Monitor for the extrapyramidal symptoms Patient may benefit from the Clozaril 25 mg twice daily as an alternative medication when become compliant with oral medication management.  Patient does not meet criteria for psychiatric inpatient admission. Supportive therapy provided about ongoing stressors.   Appreciate psychiatric consultation and sign off at this time Please contact [phone] or [phone] if needs further assistance  Disposition: May be discharged to skilled nursing facility when medically stable.    Gerlene Glassburn,JANARDHAHA R. 12/06/2014 10:58 AM

## 2014-12-06 NOTE — Clinical Social Work Note (Signed)
CSW spoke with Junious Dresser from [facility] [phone] again today, to confirm if the pt was on the waiting list. Junious Dresser reported that the pt will not be place on a waiting list nor will she review the pt's clinicals until the pt has been off IV abx and oxygen for at less 24 hours.   Kaysie Michelini, MSW, LCSWA [phone]

## 2014-12-06 NOTE — Progress Notes (Addendum)
Left message with Dysheka SW to verify that patient has been placed on waiting list at [facility]. Will await confirmation from Dysheka SW. Spoke with Dysheka SW, patient not curently on waiting list. Patient cannot be placed on waiting list until after clinicals reviewed.  Per SW note 6/20 15:12: Addendum: CSW received a call from [place] at [facility]. Junious Dresser confirmed that they received the new referral. Junious Dresser reported that they can not review the clinicals until the pt has been off IV abx and oxygen for at less 24 hours. Will leave message with Dr A to update him per his request.

## 2014-12-06 NOTE — Plan of Care (Signed)
Problem: Phase I Progression Outcomes Goal: Initial discharge plan identified Outcome: Completed/Met Date Met:  12/06/14 Needs behavior Health at [facility]

## 2014-12-06 NOTE — Progress Notes (Signed)
Patient ID: Dale Hunter, male   DOB: [birth year], 49 y.o.   MRN: [id number]  [facility] PROGRESS NOTE  Dale Hunter WUJ:[id number] DOB: 15-Mar-1966 DOA: 11/15/2014 PCP: Default, Provider, MD   Brief narrative:    67 schizophrenic M with DM admitted to [facility] 5/31 with intentional insulin overdose, he was cleared medically and pending behavioral health placement, when encephalopathy worsened 6/14 and he developed rigidity, fever up to 105F rectally. PCCM called for further eval.   MAJOR EVENTS/TEST RESULTS: 5/31 admitted to [facility] service with intentional insulin OD 6/01 Psych consultation. recommended inpt behavioral health 6/14 High fever, respiratory distress. Transferred to ICU/PCCM service. Concern for NMS. Received dantrolene and bromocriptine. Concern for sepsis. V/Z initiated. CXR c/w pulm edema vs ARDS 6/15 Procalcitonin elevated (7.62). Blood cultures positive for GNRs. Abx adjusted 6/15 TTE: LVEF 55%. Mild hypertrophy 6/16 Persistent severe bilateral AS dz on CXR. Respiratory distress. Intubated.  6/17 CXR markedly improved. Passed SBT and extubated 6/18 Confusion, agitation. Home meds restarted 6/20 care transferred to [facility] from critical care team  Assessment/Plan:    Acute hypoxic respiratory failure secondary pulmonary edema versus ARDS, multifocal PNA, ventilator dependent respiratory failure - Patient successfully extubated 12/02/2014 - Still requiring 2 - 4 L of oxygen via nasal cannula - Continue to provide oxygen to maintain saturations above 92%, taper to off as possible  - Pulmonary hygiene as patient able to tolerated   Sinus tachycardia - Overall asymptomatic, denies chest pain - Sinus rhythm - d/c telemetry   Acute kidney failure with metabolic acidosis - Prerenal in etiology, secondary to sepsis - IV fluids have been provided, this is now resolved - electrolytes remained stable, we'll repeat BMP in the morning - will stop IVF to see how pt does    Hypokalemia with hypomagnesemia and hypophosphatemia  - still low K, will supplement and repeat BMP in AM  Severe sepsis secondary to Serratia bacteremia - Unclear source, possible from multifocal PNA as suggested per CXR  - Patient successfully transitioned to Rocephin per sensitivity report - We'll request repeat blood cultures to ensure resolution - Sepsis etiology resolved - will need total of 10 days of ABX therapy per ID recommendations  - today is day #8/10of IV ABX   Diarrhea - Possibly anti-biotic induced, C. difficile neg - Resolved  Protein calorie malnutrition, moderate   - Patient refusing to eat, encourage oral intake - Has been on clear liquid diet, advance as patient able to tolerate - Nutritionist consulted  Anemia of acute illness and chronic illness, IDA - anemia panel consistent with IDA - start iron supplementation  - Repeat CBC in the morning  Diabetes mellitus type 1, with complications of hypoglycemia, neuropathies - A1c 10.4 - Continue insulin Levemir along with sliding scale insulin  Acute encephalopathy  - Secondary to acute illness sepsis, acute respiratory failure, intentional insulin overdose on 11/15/2014 imposed on history of schizophrenia  - Mental status slowly improving, patient still agitated at times, per staff aggressive at times - Keep sitter in the room - inpatient psych placement recommended by psychiatrist   Code Status: Full.  Family Communication:  plan of care discussed with the patient, no family at bedside  Disposition Plan: not ready for d/c due to IV ABX requirement, can pt be cleared for d/c to SNF (psych to address)   Indwelling devices:   ETT 6/16 >> 6/17 L IJ CVL 6/16 >>   Procedures and diagnostic studies:     Dg Chest [facility] 1 View 12/03/2014  Slight improvement in hazy right greater left airspace opacity, ? pulmonary edema although infection, hemorrhage, or protein could appear similar.   Dg Chest Port 1  View 12/03/2014 Diffuse right-sided airspace opacification, mostly new from the prior study. Worsening left basilar airspace opacity. This is concerning for severe multifocal pneumonia. ARDS cannot be excluded. Suspect small left pleural effusion.   Dg Chest Port 1 View 12/02/2014   Lines and tubes in stable position. Interim significant clearing of bilateral pulmonary infiltrates. Mild bilateral infiltrates remain.     Dg Chest Port 1 View 12/01/2014    Endotracheal tube in good position.  Central venous catheter in good position  Progression of bilateral airspace disease, possible edema.     Dg Chest Port 1 View 12/01/2014  Persistent unchanged dense bilateral pulmonary infiltrates and or edema. Heart size remains normal.     Dg Chest [facility] 11/29/2014    Interval development of pulmonary edema. Cardiogenic edema would be most likely, however, given the absence of cardiomegaly, alternative differential diagnosis should be considered, including neurogenic edema, fat emboli, drug toxicity/overdose, allergic reaction, aspiration/drowning, pulmonary hemorrhage/infarction, as well as other non-specific causes of pneumonitis.    Dg Foot Complete Right 11/15/2014  No acute osseous erosions seen. 2. Charcot joint at the hindfoot, somewhat worsened from the prior study. 3. Mild diffuse osteopenia of visualized osseous structures. 4. Os peroneum noted.   Dg Tibia/fibula Right 11/15/2014   Unchanged appearance of the right ankle with extensive deformity and degenerative changes of the hindfoot.  Soft tissue swelling   Medical Consultants:  Psych   Other Consultants:  None  Micro date and antimicrobials:  MICRO DATA: Urine 5/31 >> NEG 5/31 blood >> NEG MRSA PCR 6/01 >> NEG Urine 6/06 >> NEG C diff 6/14 >> NEG Urine 6/14 >> NEG Blood 6/14 >> 2/2 serratia  ANTIMICROBIALS:  Vanc 6/14 >> 6/15 Zosyn 6/14 >> 6/15 Cipro 6/15 >> 6/17 Ceftaz 6/15 >> 6/17 Ceftriaxone 6/17 >>   [name], MD  [facility] Pager [phone]  If 7PM-7AM, please contact night-coverage [email or web address] Password [email or web address] 12/06/2014, 10:43 AM   LOS: 21 days   HPI/Subjective: No events overnight.   Objective: Filed Vitals:   12/05/14 1415 12/05/14 1500 12/05/14 2248 12/06/14 0604  BP:   138/76 149/84  Pulse:   95 94  Temp:   98.8 F (37.1 C) 98.5 F (36.9 C)  TempSrc:   Oral Oral  Resp:   18 18  Height:      Weight:      SpO2: 87% 91% 94% 93%    Intake/Output Summary (Last 24 hours) at 12/06/14 1043 Last data filed at 12/06/14 1024  Gross per 24 hour  Intake 1325.83 ml  Output    750 ml  Net 575.83 ml    Exam:   General:  Pt is alert, NAD, flat affect, minimally verbal   Cardiovascular: Regular rate and rhythm, S1/S2, no murmurs, no rubs, no gallops  Respiratory: Rhonchi at bases and diminished bilaterally   Abdomen: Soft, non tender, non distended  Ext: Left BKA  Data Reviewed: Basic Metabolic Panel:  Recent Labs Lab 11/30/14 0447 12/01/14 0455 12/02/14 0400 12/03/14 0355 12/04/14 0510 12/06/14 0435  NA 139 142 142 143 139 139  K 4.4 3.2* 3.1* 3.3* 3.6 3.0*  CL 103 99* 100* 101 100* 100*  CO2 26 30 34* GLUCOSE 266* 116* 223* 165* 223* 158*  BUN CREATININE 1.45*  1.14 1.23 1.05 1.08 0.95  CALCIUM 7.7* 7.5* 7.7* 7.7* 7.7* 7.7*  MG 1.6*  --  1.9  --   --  1.9  PHOS 3.0  --  <1.0*  --   --  2.5   Liver Function Tests:  Recent Labs Lab 11/29/14 2325 12/01/14 0455 12/03/14 0355  AST 78* 38 20  ALT 77* 54 30  ALKPHOS 65 63 72  BILITOT 1.1 0.9 0.8  PROT 5.5* 5.6* 5.3*  ALBUMIN 2.2* 2.2* 1.8*   CBC:  Recent Labs Lab 12/01/14 0455 12/02/14 0400 12/03/14 0355 12/04/14 0510 12/06/14 0435  WBC 5.7 6.2 7.1 7.9 7.1  HGB 10.0* 9.0* 8.9* 8.9* 8.6*  HCT 31.1* 28.2* 27.2* 27.4* 27.0*  MCV 89.6 90.7 90.7 88.4 88.2  PLT 127* 134* 167 229 322   Cardiac Enzymes:  Recent Labs Lab 11/29/14 1828 11/29/14 2124 11/29/14 2325  11/30/14 0447 12/01/14 0455  CKTOTAL 1081*  --   --   --  507*  TROPONINI 0.05* 0.04* 0.04* 0.04* 0.03   CBG:  Recent Labs Lab 12/05/14 1557 12/05/14 2007 12/05/14 2357 12/06/14 0404 12/06/14 0807  GLUCAP 290* 227* 162* 139* 192*    Recent Results (from the past 240 hour(s))  Culture, blood (routine x 2)     Status: None   Collection Time: 11/29/14  3:30 PM  Result Value Ref Range Status   Specimen Description BLOOD RIGHT HAND  Final   Culture   Final    SERRATIA MARCESCENS    Report Status 12/02/2014 FINAL  Final   Organism ID, Bacteria SERRATIA MARCESCENS  Final      Susceptibility   Serratia marcescens - MIC*    CEFAZOLIN >=64 RESISTANT Resistant     CEFEPIME <=1 SENSITIVE Sensitive     CEFTAZIDIME <=1 SENSITIVE Sensitive     CEFTRIAXONE <=1 SENSITIVE Sensitive     CIPROFLOXACIN <=0.25 SENSITIVE Sensitive     GENTAMICIN 2 SENSITIVE Sensitive     TOBRAMYCIN 8 INTERMEDIATE Intermediate     TRIMETH/SULFA <=20 SENSITIVE Sensitive     * SERRATIA MARCESCENS  Urine culture     Status: None   Collection Time: 11/29/14  3:38 PM  Result Value Ref Range Status   Specimen Description URINE, RANDOM  Final   Special Requests NONE  Final   Report Status 11/30/2014 FINAL  Final  Clostridium Difficile by PCR (not at [facility])     Status: None   Collection Time: 11/29/14  3:38 PM  Result Value Ref Range Status   C difficile by pcr NEGATIVE NEGATIVE Final  Culture, blood (routine x 2)     Status: None   Collection Time: 11/29/14  3:46 PM  Result Value Ref Range Status   Specimen Description BLOOD RIGHT FOREARM  Final   Culture   Final    SERRATIA MARCESCEN    Report Status 12/02/2014 FINAL  Final     Scheduled Meds: . asenapine  10 mg Sublingual Daily  . cefTRIAXone (ROCEPHIN)  IV  1 g Intravenous Q24H  . donepezil  10 mg Oral QHS  . enoxaparin (LOVENOX) injection  40 mg Subcutaneous Q24H  . feeding supplement (RESOURCE BREEZE)  1 Container Oral TID BM  . insulin aspart   0-9 Units Subcutaneous 6 times per day  . insulin detemir  6 Units Subcutaneous Daily  . memantine  10 mg Oral BID  . pantoprazole (PROTONIX) IV  40 mg Intravenous Q24H  . pneumococcal 23 valent vaccine  0.5 mL Intramuscular Tomorrow-1000  Continuous Infusions: . sodium chloride 50 mL/hr at 12/06/14 [phone]

## 2014-12-06 NOTE — Progress Notes (Signed)
Pt  cont to refused po meds, educated on meds, pt stated "I don't want any medication, I am doing ok w/out them". Sitter remains at bedside, pt calm.

## 2014-12-07 DIAGNOSIS — Z9114 Patient's other noncompliance with medication regimen: Secondary | ICD-10-CM

## 2014-12-07 LAB — GLUCOSE, CAPILLARY
GLUCOSE-CAPILLARY: 175 mg/dL — AB (ref 65–99)
GLUCOSE-CAPILLARY: 198 mg/dL — AB (ref 65–99)
Glucose-Capillary: 214 mg/dL — ABNORMAL HIGH (ref 65–99)
Glucose-Capillary: 215 mg/dL — ABNORMAL HIGH (ref 65–99)
Glucose-Capillary: 258 mg/dL — ABNORMAL HIGH (ref 65–99)

## 2014-12-07 LAB — CBC
HCT: 29.3 % — ABNORMAL LOW (ref 39.0–52.0)
HEMOGLOBIN: 9.3 g/dL — AB (ref 13.0–17.0)
MCH: 28.1 pg (ref 26.0–34.0)
MCHC: 31.7 g/dL (ref 30.0–36.0)
MCV: 88.5 fL (ref 78.0–100.0)
Platelets: 390 10*3/uL (ref 150–400)
RBC: 3.31 MIL/uL — AB (ref 4.22–5.81)
RDW: 14.8 % (ref 11.5–15.5)
WBC: 6 10*3/uL (ref 4.0–10.5)

## 2014-12-07 LAB — BASIC METABOLIC PANEL
ANION GAP: 11 (ref 5–15)
BUN: 6 mg/dL (ref 6–20)
CO2: 30 mmol/L (ref 22–32)
Calcium: 8 mg/dL — ABNORMAL LOW (ref 8.9–10.3)
Chloride: 99 mmol/L — ABNORMAL LOW (ref 101–111)
Creatinine, Ser: 0.91 mg/dL (ref 0.61–1.24)
GFR calc Af Amer: >60 mL/min (ref >60)
GFR calc non Af Amer: >60 mL/min (ref >60)
Glucose, Bld: 246 mg/dL — ABNORMAL HIGH (ref 65–99)
POTASSIUM: 3.7 mmol/L (ref 3.5–5.1)
SODIUM: 140 mmol/L (ref 135–145)

## 2014-12-07 MED ORDER — DONEPEZIL HCL 10 MG PO TABS: 10.0000 mg | ORAL_TABLET | Freq: Every day | ORAL | Status: AC

## 2014-12-07 MED ORDER — FERROUS SULFATE 325 (65 FE) MG PO TABS: 325.0000 mg | ORAL_TABLET | Freq: Two times a day (BID) | ORAL | Status: AC

## 2014-12-07 MED ORDER — MEMANTINE HCL 10 MG PO TABS: 10.0000 mg | ORAL_TABLET | Freq: Two times a day (BID) | ORAL | Status: AC

## 2014-12-07 MED ORDER — PANTOPRAZOLE SODIUM 40 MG PO TBEC: 40.0000 mg | DELAYED_RELEASE_TABLET | Freq: Every day | ORAL | Status: DC

## 2014-12-07 MED ORDER — CIPROFLOXACIN HCL 500 MG PO TABS: 500.0000 mg | ORAL_TABLET | Freq: Two times a day (BID) | ORAL | Status: AC

## 2014-12-07 NOTE — Discharge Instructions (Signed)
Confusion Confusion is the inability to think with your usual speed or clarity. Confusion may come on quickly or slowly over time. How quickly the confusion comes on depends on the cause. Confusion can be due to any number of causes. CAUSES   Concussion, head injury, or head trauma.  Seizures.  Stroke.  Fever.  Brain tumor.  Age related decreased brain function (dementia).  Heightened emotional states like rage or terror.  Mental illness in which the person loses the ability to determine what is real and what is not (hallucinations).  Infections such as a urinary tract infection (UTI).  Toxic effects from alcohol, drugs, or prescription medicines.  Dehydration and an imbalance of salts in the body (electrolytes).  Lack of sleep.  Low blood sugar (diabetes).  Low levels of oxygen from conditions such as chronic lung disorders.  Drug interactions or other medicine side effects.  Nutritional deficiencies, especially niacin, thiamine, vitamin C, or vitamin B.  Sudden drop in body temperature (hypothermia).  Change in routine, such as when traveling or hospitalized. SIGNS AND SYMPTOMS  People often describe their thinking as cloudy or unclear when they are confused. Confusion can also include feeling disoriented. That means you are unaware of where or who you are. You may also not know what the date or time is. If confused, you may also have difficulty paying attention, remembering, and making decisions. Some people also act aggressively when they are confused.  DIAGNOSIS  The medical evaluation of confusion may include:  Blood and urine tests.  X-rays.  Brain and nervous system tests.  Analyzing your brain waves (electroencephalogram or EEG).  Magnetic resonance imaging (MRI) of your head.  Computed tomography (CT) scan of your head.  Mental status tests in which your health care provider may ask many questions. Some of these questions may seem silly or strange,  but they are a very important test to help diagnose and treat confusion. TREATMENT  An admission to the hospital may not be needed, but a person with confusion should not be left alone. Stay with a family member or friend until the confusion clears. Avoid alcohol, pain relievers, or sedative drugs until you have fully recovered. Do not drive until directed by your health care provider. HOME CARE INSTRUCTIONS  What family and friends can do:  To find out if someone is confused, ask the person to state his or her name, age, and the date. If the person is unsure or answers incorrectly, he or she is confused.  Always introduce yourself, no matter how well the person knows you.  Often remind the person of his or her location.  Place a calendar and clock near the confused person.  Help the person with his or her medicines. You may want to use a pill box, an alarm as a reminder, or give the person each dose as prescribed.  Talk about current events and plans for the day.  Try to keep the environment calm, quiet, and peaceful.  Make sure the person keeps follow-up visits with his or her health care provider. PREVENTION  Ways to prevent confusion:  Avoid alcohol.  Eat a balanced diet.  Get enough sleep.  Take medicine only as directed by your health care provider.  Do not become isolated. Spend time with other people and make plans for your days.  Keep careful watch on your blood sugar levels if you are diabetic. SEEK IMMEDIATE MEDICAL CARE IF:   You develop severe headaches, repeated vomiting, seizures, blackouts, or   slurred speech.  There is increasing confusion, weakness, numbness, restlessness, or personality changes.  You develop a loss of balance, have marked dizziness, feel uncoordinated, or fall.  You have delusions, hallucinations, or develop severe anxiety.  Your family members think you need to be rechecked. Document Released: 07/11/2004 Document Revised: 10/18/2013  Document Reviewed: 07/09/2013 Exit[employer] Patient Information 2015 [employer]. This information is not intended to replace advice given to you by your health care provider. Make sure you discuss any questions you have with your health care provider.  

## 2014-12-07 NOTE — Clinical Social Work Note (Signed)
Per MD patient ready for DC home. RN and patient/family notified of DC home. Address confirmed by patient/family. DC packet on chart. RN will request transportation for patient after patient has voided EMS number is [phone]. CSW signing off at this time.

## 2014-12-07 NOTE — Progress Notes (Signed)
Utilization Review completed. Gevon Markus RN BSN CM 

## 2014-12-07 NOTE — Discharge Summary (Signed)
Physician Discharge Summary  Dale Hunter ZOX:[id number] DOB: 03-12-1966 DOA: 11/15/2014  PCP: Default, Provider, MD  Admit date: 11/15/2014 Discharge date: 12/07/2014  Recommendations for Outpatient Follow-up:  1. Pt will need to follow up with PCP in 2-3 weeks post discharge 2. Please obtain BMP to evaluate electrolytes and kidney function 3. Pt advised to continue Cipro for two more days post discharge 4. Pt needs home oxygen 5. Pt also advised to follow up with psychiatrist in an outpatient setting, mother made aware   Discharge Diagnoses:  Principal Problem:   Non compliance w medication regimen Active Problems:   Diabetes mellitus   Insulin overdose   Hypoglycemia   Severe diabetic hypoglycemia   Swelling   Schizophrenia, paranoid  Discharge Condition: Stable  Diet recommendation: Heart healthy diet discussed in details    Brief narrative:    19 schizophrenic M with DM admitted to [facility] 5/31 with intentional insulin overdose, he was cleared medically and pending behavioral health placement, when encephalopathy worsened 6/14 and he developed rigidity, fever up to 105F rectally. PCCM called for further eval.   MAJOR EVENTS/TEST RESULTS: 5/31 admitted to [facility] service with intentional insulin OD 6/01 Psych consultation. recommended inpt behavioral health 6/14 High fever, respiratory distress. Transferred to ICU/PCCM service. Concern for NMS. Received dantrolene and bromocriptine. Concern for sepsis. V/Z initiated. CXR c/w pulm edema vs ARDS 6/15 Procalcitonin elevated (7.62). Blood cultures positive for GNRs. Abx adjusted 6/15 TTE: LVEF 55%. Mild hypertrophy 6/16 Persistent severe bilateral AS dz on CXR. Respiratory distress. Intubated.  6/17 CXR markedly improved. Passed SBT and extubated 6/18 Confusion, agitation. Home meds restarted 6/20 care transferred to [facility] from critical care team  Assessment/Plan:    Acute on chronic hypoxic respiratory failure  secondary pulmonary edema versus ARDS, multifocal PNA, ventilator dependent respiratory failure - Patient successfully extubated 12/02/2014 - Still requiring 1-2 L O2 via nasal canula  - Continue to provide oxygen to maintain saturations above 92% - Pulmonary hygiene provided  Sinus tachycardia - Overall asymptomatic, denies chest pain - Sinus rhythm this AM   Acute kidney failure with metabolic acidosis - Prerenal in etiology, secondary to sepsis - IV fluids have been provided, this is now resolved - electrolytes remained stable, pt tolerating regular diet well   Hypokalemia with hypomagnesemia and hypophosphatemia  - supplemented and WNL this AM   Severe sepsis secondary to Serratia bacteremia - Unclear source, possible from multifocal PNA as suggested per CXR  - Patient successfully transitioned to Rocephin per sensitivity report - repeat blood cultures with no growth to date  - pt needs to complete therapy with Cipro for 2 more days post discharge   Diarrhea - Possibly anti-biotic induced, C. difficile neg - Resolved  Protein calorie malnutrition, moderate  - Patient refusing to eat, encourage oral intake - Has been on clear liquid diet, advanced diet to regular and pt tolerating well  - Nutritionist consulted  Anemia of acute illness and chronic illness, IDA - anemia panel consistent with IDA - start iron supplementation upon discharge   Diabetes mellitus type 1, with complications of hypoglycemia, neuropathies - A1c 10.4 - Continue insulin Levemir upon discharge   Acute encephalopathy  - Secondary to acute illness sepsis, acute respiratory failure, intentional insulin overdose on 11/15/2014 imposed on history of schizophrenia  - Mental status improved, pt wants to eat more  Code Status: Full.  Family Communication: plan of care discussed with the patient, mother over the phone   Disposition Plan: d/c home   Indwelling  devices:   ETT 6/16 >> 6/17 L IJ  CVL 6/16 >> 6/22  Procedures and diagnostic studies:    Dg Chest Port 1 View 12-04-2014 Slight improvement in hazy right greater left airspace opacity, ? pulmonary edema although infection, hemorrhage, or protein could appear similar.   Dg Chest Port 1 View 04-Dec-2014 Diffuse right-sided airspace opacification, mostly new from the prior study. Worsening left basilar airspace opacity. This is concerning for severe multifocal pneumonia. ARDS cannot be excluded. Suspect small left pleural effusion.   Dg Chest Port 1 View 12/02/2014 Lines and tubes in stable position. Interim significant clearing of bilateral pulmonary infiltrates. Mild bilateral infiltrates remain.   Dg Chest Port 1 View 12/01/2014 Endotracheal tube in good position. Central venous catheter in good position Progression of bilateral airspace disease, possible edema.   Dg Chest Port 1 View 12/01/2014 Persistent unchanged dense bilateral pulmonary infiltrates and or edema. Heart size remains normal.   Dg Chest [facility] 11/29/2014 Interval development of pulmonary edema. Cardiogenic edema would be most likely, however, given the absence of cardiomegaly, alternative differential diagnosis should be considered, including neurogenic edema, fat emboli, drug toxicity/overdose, allergic reaction, aspiration/drowning, pulmonary hemorrhage/infarction, as well as other non-specific causes of pneumonitis.   Dg Foot Complete Right 11/15/2014 No acute osseous erosions seen. 2. Charcot joint at the hindfoot, somewhat worsened from the prior study. 3. Mild diffuse osteopenia of visualized osseous structures. 4. Os peroneum noted.   Dg Tibia/fibula Right 11/15/2014 Unchanged appearance of the right ankle with extensive deformity and degenerative changes of the hindfoot. Soft tissue swelling   Medical Consultants:  Psych   Other Consultants:  None  Micro date and antimicrobials:  MICRO DATA: Urine 5/31 >>  NEG 5/31 blood >> NEG MRSA PCR 6/01 >> NEG Urine 6/06 >> NEG C diff 6/14 >> NEG Urine 6/14 >> NEG Blood 6/14 >> 2/2 serratia  ANTIMICROBIALS:  Vanc 6/14 >> 6/15 Zosyn 6/14 >> 6/15 Cipro 6/15 >> 6/17 Ceftaz 6/15 >> 6/17 Ceftriaxone 6/17 >> 6/22 Cipro 6/23 --> 6/24     Discharge Exam: Filed Vitals:   12/07/14 0453  BP: 143/85  Pulse: 93  Temp: 99.1 F (37.3 C)  Resp: 14   Filed Vitals:   12/06/14 1000 12/06/14 1348 12/06/14 2147 12/07/14 0453  BP: 149/75 129/68 131/66 143/85  Pulse: 92 92 84 93  Temp:  98.5 F (36.9 C) 97.9 F (36.6 C) 99.1 F (37.3 C)  TempSrc:  Oral Oral Oral  Resp: Height:      Weight:      SpO2: 97% 96% 98% 98%    General: Pt is alert, follows commands appropriately, not in acute distress Cardiovascular: Regular rate and rhythm, S1/S2 +, no murmurs, no rubs, no gallops Respiratory: Clear to auscultation bilaterally, no wheezing, no crackles, no rhonchi Abdominal: Soft, non tender, non distended, bowel sounds +, no guarding Extremities: no edema, no cyanosis, pulses palpable bilaterally DP and PT  Discharge Instructions  Discharge Instructions    Diet - low sodium heart healthy    Complete by:  As directed      Increase activity slowly    Complete by:  As directed             Medication List    STOP taking these medications        clopidogrel 75 MG tablet  Commonly known as:  PLAVIX     insulin aspart 100 UNIT/ML injection  Commonly known as:  novoLOG  TAKE these medications        Asenapine Maleate 10 MG Subl  Place 10 mg under the tongue daily.     ciprofloxacin 500 MG tablet  Commonly known as:  CIPRO  Take 1 tablet (500 mg total) by mouth 2 (two) times daily.     donepezil 10 MG tablet  Commonly known as:  ARICEPT  Take 1 tablet (10 mg total) by mouth at bedtime.     ferrous sulfate 325 (65 FE) MG tablet  Take 1 tablet (325 mg total) by mouth 2 (two) times daily with a meal.     insulin  detemir 100 UNIT/ML injection  Commonly known as:  LEVEMIR  Inject 15-18 Units into the skin at bedtime.     memantine 10 MG tablet  Commonly known as:  NAMENDA  Take 1 tablet (10 mg total) by mouth 2 (two) times daily.     Vitamin D3 2000 UNITS Tabs  Take 4,000 Units by mouth daily.          The results of significant diagnostics from this hospitalization (including imaging, microbiology, ancillary and laboratory) are listed below for reference.     Microbiology: Recent Results (from the past 240 hour(s))  Culture, blood (routine x 2)     Status: None   Collection Time: 11/29/14  3:30 PM  Result Value Ref Range Status   Specimen Description BLOOD RIGHT HAND  Final   Special Requests BOTTLES DRAWN AEROBIC ONLY 10CC  Final   Culture   Final    SERRATIA MARCESCENS Note: Gram Stain Report Called to,Read Back By and Verified With: AMY KEETON BY INGRAM A 11/30/14 110PM Performed at [facility]    Report Status 12/02/2014 FINAL  Final   Organism ID, Bacteria SERRATIA MARCESCENS  Final      Susceptibility   Serratia marcescens - MIC*    CEFAZOLIN >=64 RESISTANT Resistant     CEFEPIME <=1 SENSITIVE Sensitive     CEFTAZIDIME <=1 SENSITIVE Sensitive     CEFTRIAXONE <=1 SENSITIVE Sensitive     CIPROFLOXACIN <=0.25 SENSITIVE Sensitive     GENTAMICIN 2 SENSITIVE Sensitive     TOBRAMYCIN 8 INTERMEDIATE Intermediate     TRIMETH/SULFA <=20 SENSITIVE Sensitive     * SERRATIA MARCESCENS  Urine culture     Status: None   Collection Time: 11/29/14  3:38 PM  Result Value Ref Range Status   Specimen Description URINE, RANDOM  Final   Special Requests NONE  Final   Culture NO GROWTH Performed at [facility]   Final   Report Status 11/30/2014 FINAL  Final  Clostridium Difficile by PCR (not at [facility])     Status: None   Collection Time: 11/29/14  3:38 PM  Result Value Ref Range Status   C difficile by pcr NEGATIVE NEGATIVE Final  Culture, blood (routine x 2)     Status:  None   Collection Time: 11/29/14  3:46 PM  Result Value Ref Range Status   Specimen Description BLOOD RIGHT FOREARM  Final   Special Requests BOTTLES DRAWN AEROBIC ONLY 7CC  Final   Culture   Final    SERRATIA MARCESCENS Note: SUSCEPTIBILITIES PERFORMED ON PREVIOUS CULTURE WITHIN THE LAST 5 DAYS. Note: Gram Stain Report Called to,Read Back By and Verified With: AMY KEETON BY INGRAM A 11/30/14 110PM Performed at [facility]    Report Status 12/02/2014 FINAL  Final  Culture, blood (routine x 2)     Status: None (Preliminary  result)   Collection Time: 12/05/14 12:30 PM  Result Value Ref Range Status   Specimen Description BLOOD RIGHT ANTECUBITAL  Final   Special Requests BOTTLES DRAWN AEROBIC AND ANAEROBIC 5CC 5CC  Final   Culture NO GROWTH < 24 HOURS  Final   Report Status PENDING  Incomplete  Culture, blood (routine x 2)     Status: None (Preliminary result)   Collection Time: 12/05/14 12:32 PM  Result Value Ref Range Status   Specimen Description BLOOD LEFT WRIST  Final   Special Requests BOTTLES DRAWN AEROBIC ONLY 5CC  Final   Culture NO GROWTH < 24 HOURS  Final   Report Status PENDING  Incomplete     Labs: Basic Metabolic Panel:  Recent Labs Lab 12/02/14 0400 12/03/14 0355 12/04/14 0510 12/06/14 0435 12/07/14 0545  NA 142 143 139 139 140  K 3.1* 3.3* 3.6 3.0* 3.7  CL 100* 101 100* 100* 99*  CO2 34* 31 27 30 30   GLUCOSE 223* 165* 223* 158* 246*  BUN 17 12 11 10 6   CREATININE 1.23 1.05 1.08 0.95 0.91  CALCIUM 7.7* 7.7* 7.7* 7.7* 8.0*  MG 1.9  --   --  1.9  --   PHOS <1.0*  --   --  2.5  --    Liver Function Tests:  Recent Labs Lab 12/01/14 0455 12/03/14 0355  AST 38 20  ALT 54 30  ALKPHOS 63 72  BILITOT 0.9 0.8  PROT 5.6* 5.3*  ALBUMIN 2.2* 1.8*   No results for input(s): LIPASE, AMYLASE in the last 168 hours. No results for input(s): AMMONIA in the last 168 hours. CBC:  Recent Labs Lab 12/02/14 0400 12/03/14 0355 12/04/14 0510  12/06/14 0435 12/07/14 0545  WBC 6.2 7.1 7.9 7.1 6.0  HGB 9.0* 8.9* 8.9* 8.6* 9.3*  HCT 28.2* 27.2* 27.4* 27.0* 29.3*  MCV 90.7 90.7 88.4 88.2 88.5  PLT 134* 167 229 322 390   Cardiac Enzymes:  Recent Labs Lab 12/01/14 0455  CKTOTAL 507*  TROPONINI 0.03   BNP: BNP (last 3 results)  Recent Labs  11/29/14 1530 11/29/14 1854 11/30/14 1130  BNP 132.7* 157.7* 200.8*    ProBNP (last 3 results) No results for input(s): PROBNP in the last 8760 hours.  CBG:  Recent Labs Lab 12/06/14 1644 12/06/14 2034 12/07/14 0034 12/07/14 0353 12/07/14 0815  GLUCAP 278* 184* 214* 215* 175*     SIGNED: Time coordinating discharge: 30 minutes  MAGICK-Eiley Mcginnity, MD  [facility] 12/07/2014, 10:50 AM Pager [phone]  If 7PM-7AM, please contact night-coverage [email or web address] Password [email or web address]

## 2014-12-07 NOTE — Progress Notes (Signed)
Nsg Discharge Note  Admit Date:  11/15/2014 Discharge date: 12/07/2014   Wayburn Classon to be D/C'd Home with home health. per MD order.  AVS completed.  Copy for chart, and copy for patient signed, and dated. Mother of patient  Signed and took AVS packet home.  Patient/caregiver able to verbalize understanding.  Discharge Medication:   Medication List    STOP taking these medications        clopidogrel 75 MG tablet  Commonly known as:  PLAVIX     insulin aspart 100 UNIT/ML injection  Commonly known as:  novoLOG      TAKE these medications        Asenapine Maleate 10 MG Subl  Place 10 mg under the tongue daily.     ciprofloxacin 500 MG tablet  Commonly known as:  CIPRO  Take 1 tablet (500 mg total) by mouth 2 (two) times daily.     donepezil 10 MG tablet  Commonly known as:  ARICEPT  Take 1 tablet (10 mg total) by mouth at bedtime.     ferrous sulfate 325 (65 FE) MG tablet  Take 1 tablet (325 mg total) by mouth 2 (two) times daily with a meal.     insulin detemir 100 UNIT/ML injection  Commonly known as:  LEVEMIR  Inject 15-18 Units into the skin at bedtime.     memantine 10 MG tablet  Commonly known as:  NAMENDA  Take 1 tablet (10 mg total) by mouth 2 (two) times daily.     Vitamin D3 2000 UNITS Tabs  Take 4,000 Units by mouth daily.        Discharge Assessment: Filed Vitals:   12/07/14 1358  BP: 113/68  Pulse: 91  Temp: 98 F (36.7 C)  Resp: 16   Skin clean, dry and intact without evidence of skin break down, no evidence of skin tears noted. IV catheter discontinued intact. Site without signs and symptoms of complications - no redness or edema noted at insertion site, patient denies c/o pain - only slight tenderness at site.  Dressing with slight pressure applied.  D/c Instructions-Education: Discharge instructions given to patient/family with verbalized understanding. D/c education completed with patient/family including follow up instructions, medication  list, d/c activities limitations if indicated, with other d/c instructions as indicated by MD - patient able to verbalize understanding, all questions fully answered. Patient instructed to return to ED, call 911, or call MD for any changes in condition.  Patient escorted via EMS to home. Kern Reap, RN 12/07/2014 7:25 PM

## 2014-12-07 NOTE — Progress Notes (Addendum)
Pt. Will need home oxygen, oxygen drops to 84% at rest on room air. Will need one L of oxygen.

## 2014-12-07 NOTE — Progress Notes (Addendum)
Inpatient Diabetes Program Recommendations  AACE/ADA: New Consensus Statement on Inpatient Glycemic Control (2013)  Target Ranges:  Prepandial:   less than 140 mg/dL      Peak postprandial:   less than 180 mg/dL (1-2 hours)      Critically ill patients:  140 - 180 mg/dL   Results for ADANTE, HUSSER (MRN [id number]) as of 12/07/2014 08:31  Ref. Range 12/06/2014 08:07 12/06/2014 12:02 12/06/2014 16:44 12/06/2014 20:34 12/07/2014 00:34 12/07/2014 03:53 12/07/2014 08:15  Glucose-Capillary Latest Ref Range: 65-99 mg/dL 270 (H) 786 (H) 754 (H) 184 (H) 214 (H) 215 (H) 175 (H)   Diabetes history: DM 1 Current orders for Inpatient glycemic control: Levemir 6 units Daily, Novolog 0-9 units Q4hrs  Inpatient Diabetes Program Recommendations Insulin - Basal: Glucose is consistently in the 200's around meal times, please consider increasing basal insulin to 8 units Daily. Insulin - Meal Coverage: Consider adding meal coverage Novolog 3 units TID with meals, when eating solid foods.   Thanks,  Christena Deem RN, MSN, [facility] Inpatient Diabetes Coordinator Team Pager [phone]

## 2014-12-07 NOTE — Progress Notes (Signed)
HRI with [place] set up for home health RN and SW through Philis Fendt RN, HOme O2 will be delivered to room prior to discharge via Jerm[place] with [facility]

## 2014-12-09 NOTE — Progress Notes (Signed)
Received notification this am that [facility] would not accept patient for home health services. Davonna Belling RN with [facility] stated that she will establish home health with another agency. Patient's mother margaret updated that [facility] working on home health at this point. Per [facility] likely to accept. CM LM with [facility] to call pt's mother and update her on plan.

## 2014-12-10 LAB — CULTURE, BLOOD (ROUTINE X 2)
Culture: NO GROWTH
Culture: NO GROWTH

## 2017-01-26 IMAGING — CR DG CHEST 1V PORT
1 series · 1 of 1 positions shown · non-contrast
Comparison: 12/01/2014.

CLINICAL DATA: Intubation.

EXAM:
PORTABLE CHEST - 1 VIEW

[AP]
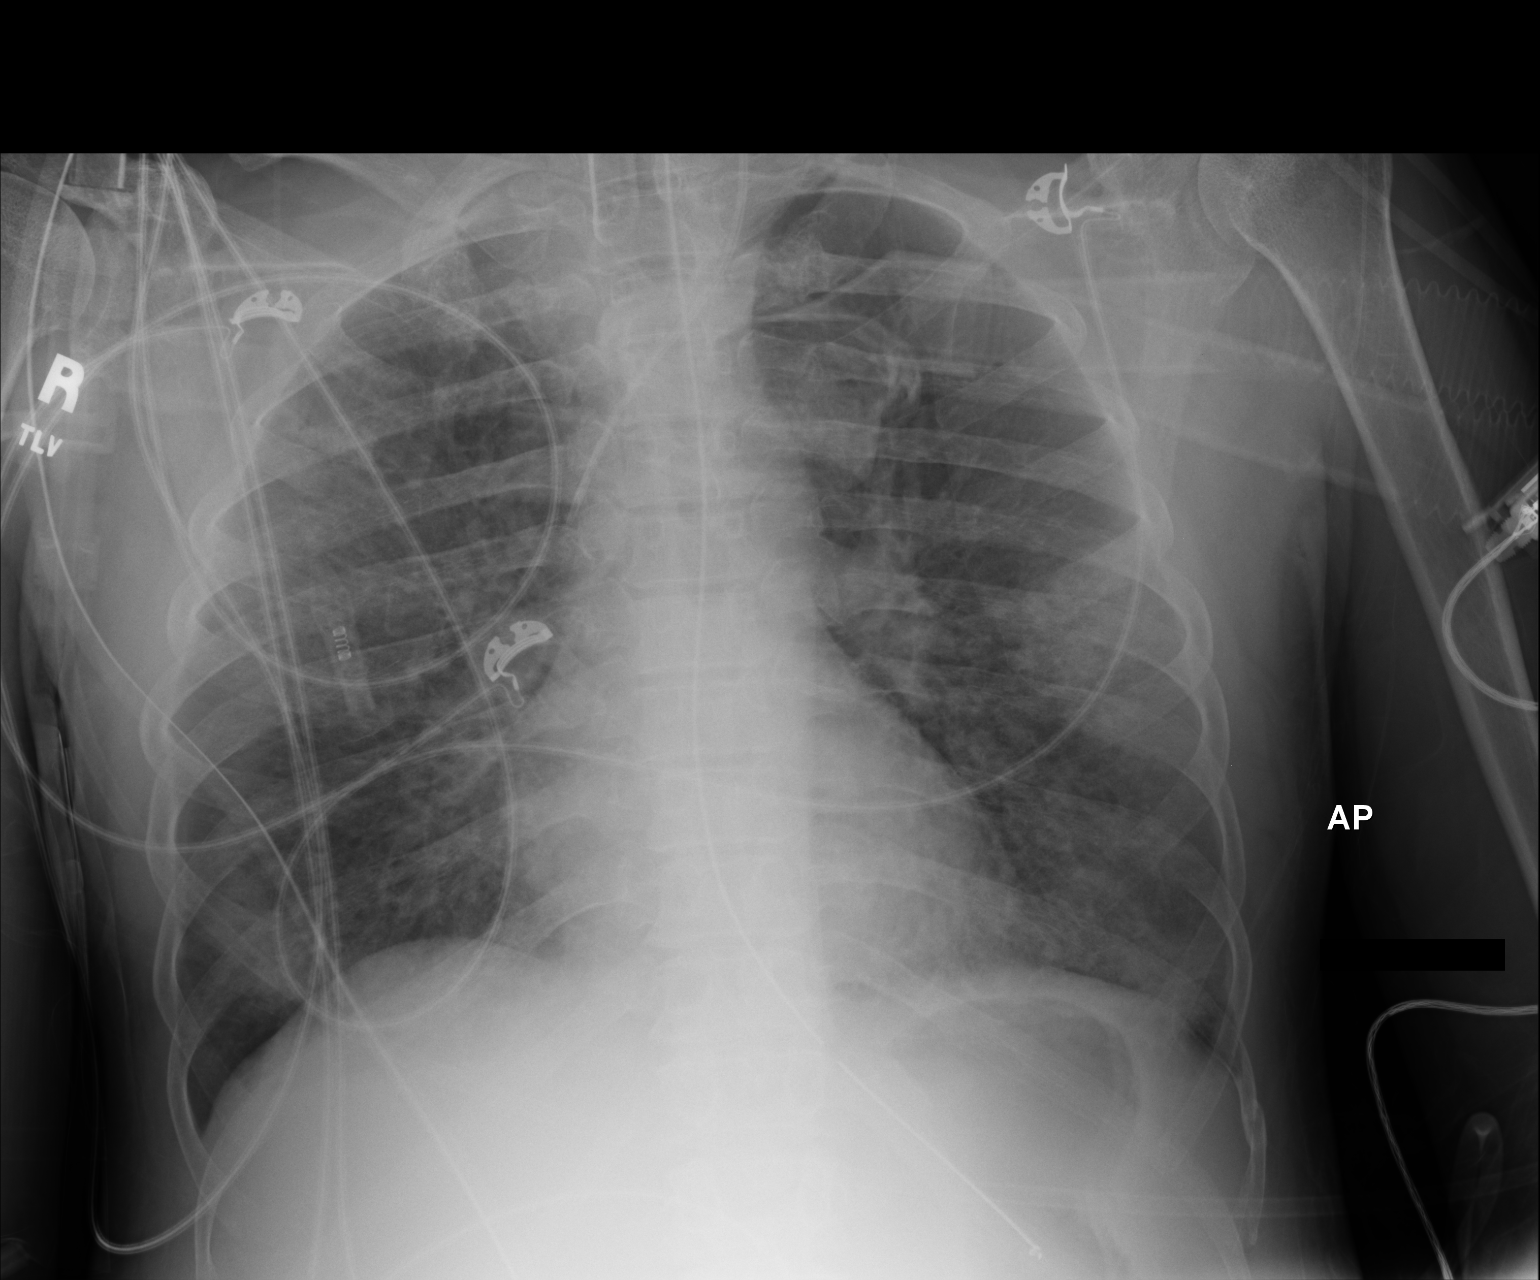

[1 of 1 positions shown; findings below may reference images not displayed]

FINDINGS: Endotracheal tube 6 cm above the carina in stable position. Left IJ
line and NG tube in stable position. Mediastinum hilar structures
normal. Interim significant clearing of bilateral pulmonary
infiltrates. No pleural effusion or pneumothorax.
IMPRESSION: 1. Lines and tubes in stable position.
2. Interim significant clearing of bilateral pulmonary infiltrates.
Mild bilateral infiltrates remain.

## 2019-09-09 ENCOUNTER — Ambulatory Visit: Payer: Medicaid Other | Attending: Internal Medicine

## 2019-09-09 DIAGNOSIS — Z23 Encounter for immunization: Secondary | ICD-10-CM

## 2019-09-09 NOTE — Progress Notes (Signed)
   Covid-19 Vaccination Clinic  Name:  Brian Zeitlin    MRN: [id number] DOB: 1966/04/13  09/09/2019  Mr. Crutchfield was observed post Covid-19 immunization for 15 minutes without incident. He was provided with Vaccine Information Sheet and instruction to access the V-Safe system.   Mr. Spira was instructed to call 911 with any severe reactions post vaccine: Marland Kitchen Difficulty breathing  . Swelling of face and throat  . A fast heartbeat  . A bad rash all over body  . Dizziness and weakness   Immunizations Administered    Name Date Dose VIS Date Route   Pfizer COVID-19 Vaccine 09/09/2019  2:32 PM 0.3 mL 05/28/2019 Intramuscular   Manufacturer: [employer], [employer]   Lot: [id number]   NDC: [address]-4

## 2019-10-04 ENCOUNTER — Ambulatory Visit: Payer: Medicaid Other | Attending: Internal Medicine

## 2019-10-04 DIAGNOSIS — Z23 Encounter for immunization: Secondary | ICD-10-CM

## 2019-10-04 NOTE — Progress Notes (Signed)
   Covid-19 Vaccination Clinic  Name:  Dale Hunter    MRN: [id number] DOB: [birth year]  10/04/2019  [name] was observed post Covid-19 immunization for 15 minutes without incident. He was provided with Vaccine Information Sheet and instruction to access the V-Safe system.   [name] was instructed to call 911 with any severe reactions post vaccine: Marland Kitchen Difficulty breathing  . Swelling of face and throat  . A fast heartbeat  . A bad rash all over body  . Dizziness and weakness   Immunizations Administered    Name Date Dose VIS Date Route   Pfizer COVID-19 Vaccine 10/04/2019  2:08 PM 0.3 mL 08/11/2018 Intramuscular   Manufacturer: [employer], [employer]   Lot: [id number]   NDC: [address]-7
